# Patient Record
Sex: Female | Born: 1945 | Hispanic: No | Marital: Married | Smoking: Former smoker
Health system: Southern US, Community
[De-identification: ages and names within clinical notes are randomized; demographics above are authoritative.]

## PROBLEM LIST (undated history)

## (undated) DIAGNOSIS — E782 Mixed hyperlipidemia: Secondary | ICD-10-CM

## (undated) DIAGNOSIS — Z95820 Peripheral vascular angioplasty status with implants and grafts: Secondary | ICD-10-CM

## (undated) DIAGNOSIS — I10 Essential (primary) hypertension: Secondary | ICD-10-CM

## (undated) DIAGNOSIS — R6889 Other general symptoms and signs: Secondary | ICD-10-CM

## (undated) DIAGNOSIS — I739 Peripheral vascular disease, unspecified: Secondary | ICD-10-CM

## (undated) DIAGNOSIS — E119 Type 2 diabetes mellitus without complications: Secondary | ICD-10-CM

## (undated) DIAGNOSIS — F17200 Nicotine dependence, unspecified, uncomplicated: Secondary | ICD-10-CM

## (undated) DIAGNOSIS — N200 Calculus of kidney: Secondary | ICD-10-CM

## (undated) DIAGNOSIS — R0602 Shortness of breath: Secondary | ICD-10-CM

## (undated) HISTORY — PX: CATARACT EXTRACTION W/ INTRAOCULAR LENS  IMPLANT, BILATERAL: SHX1307

---
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
## 1999-02-07 ENCOUNTER — Other Ambulatory Visit: Admission: RE | Admit: 1999-02-07 | Discharge: 1999-02-07 | Payer: Self-pay

## 1999-08-04 HISTORY — PX: SHOULDER ARTHROSCOPY: SHX128

## 1999-12-08 ENCOUNTER — Encounter: Admission: RE | Admit: 1999-12-08 | Discharge: 1999-12-29 | Payer: Self-pay

## 2000-04-11 ENCOUNTER — Other Ambulatory Visit: Admission: RE | Admit: 2000-04-11 | Discharge: 2000-04-11 | Payer: Self-pay | Admitting: *Deleted

## 2000-06-27 ENCOUNTER — Ambulatory Visit (HOSPITAL_COMMUNITY): Admission: RE | Admit: 2000-06-27 | Discharge: 2000-06-27 | Payer: Self-pay | Admitting: Gastroenterology

## 2001-04-15 ENCOUNTER — Other Ambulatory Visit: Admission: RE | Admit: 2001-04-15 | Discharge: 2001-04-15 | Payer: Self-pay | Admitting: Obstetrics and Gynecology

## 2002-05-22 ENCOUNTER — Other Ambulatory Visit: Admission: RE | Admit: 2002-05-22 | Discharge: 2002-05-22 | Payer: Self-pay | Admitting: Obstetrics and Gynecology

## 2003-10-11 ENCOUNTER — Encounter: Admission: RE | Admit: 2003-10-11 | Discharge: 2003-10-11 | Payer: Self-pay | Admitting: Internal Medicine

## 2011-12-14 ENCOUNTER — Encounter (HOSPITAL_COMMUNITY)
Admission: AD | Disposition: A | Discharge status: Home / Self Care | Payer: Self-pay | Source: Ambulatory Visit | Attending: Cardiology

## 2011-12-14 ENCOUNTER — Encounter (HOSPITAL_COMMUNITY): Payer: Self-pay | Admitting: Cardiology

## 2011-12-14 ENCOUNTER — Ambulatory Visit (HOSPITAL_COMMUNITY)
Admission: AD | Admit: 2011-12-14 | Discharge: 2011-12-14 | Disposition: A | Discharge status: Home / Self Care | Payer: Medicare Other | Source: Ambulatory Visit | Attending: Cardiology | Admitting: Cardiology

## 2011-12-14 ENCOUNTER — Other Ambulatory Visit (HOSPITAL_COMMUNITY): Payer: Self-pay | Admitting: Cardiology

## 2011-12-14 DIAGNOSIS — I200 Unstable angina: Secondary | ICD-10-CM

## 2011-12-14 DIAGNOSIS — I10 Essential (primary) hypertension: Secondary | ICD-10-CM

## 2011-12-14 DIAGNOSIS — E119 Type 2 diabetes mellitus without complications: Secondary | ICD-10-CM | POA: Insufficient documentation

## 2011-12-14 DIAGNOSIS — I2510 Atherosclerotic heart disease of native coronary artery without angina pectoris: Secondary | ICD-10-CM | POA: Insufficient documentation

## 2011-12-14 DIAGNOSIS — R0609 Other forms of dyspnea: Secondary | ICD-10-CM | POA: Insufficient documentation

## 2011-12-14 DIAGNOSIS — I309 Acute pericarditis, unspecified: Secondary | ICD-10-CM | POA: Insufficient documentation

## 2011-12-14 DIAGNOSIS — R0989 Other specified symptoms and signs involving the circulatory and respiratory systems: Secondary | ICD-10-CM | POA: Insufficient documentation

## 2011-12-14 DIAGNOSIS — E782 Mixed hyperlipidemia: Secondary | ICD-10-CM

## 2011-12-14 DIAGNOSIS — E785 Hyperlipidemia, unspecified: Secondary | ICD-10-CM | POA: Insufficient documentation

## 2011-12-14 DIAGNOSIS — I319 Disease of pericardium, unspecified: Secondary | ICD-10-CM

## 2011-12-14 DIAGNOSIS — E1169 Type 2 diabetes mellitus with other specified complication: Secondary | ICD-10-CM

## 2011-12-14 DIAGNOSIS — F17200 Nicotine dependence, unspecified, uncomplicated: Secondary | ICD-10-CM

## 2011-12-14 HISTORY — DX: Nicotine dependence, unspecified, uncomplicated: F17.200

## 2011-12-14 HISTORY — DX: Mixed hyperlipidemia: E78.2

## 2011-12-14 HISTORY — PX: LEFT HEART CATHETERIZATION WITH CORONARY ANGIOGRAM: SHX5451

## 2011-12-14 HISTORY — DX: Shortness of breath: R06.02

## 2011-12-14 HISTORY — DX: Essential (primary) hypertension: I10

## 2011-12-14 LAB — DIFFERENTIAL
Basophils Relative: 1 % (ref 0–1)
Eosinophils Absolute: 0.1 10*3/uL (ref 0.0–0.7)
Eosinophils Relative: 1 % (ref 0–5)
Lymphs Abs: 2.9 10*3/uL (ref 0.7–4.0)
Monocytes Absolute: 0.6 10*3/uL (ref 0.1–1.0)
Monocytes Relative: 8 % (ref 3–12)

## 2011-12-14 LAB — CBC
HCT: 37.9 % (ref 36.0–46.0)
Hemoglobin: 13.3 g/dL (ref 12.0–15.0)
MCH: 31.7 pg (ref 26.0–34.0)
MCHC: 35.1 g/dL (ref 30.0–36.0)
MCV: 90.2 fL (ref 78.0–100.0)

## 2011-12-14 LAB — CARDIAC PANEL(CRET KIN+CKTOT+MB+TROPI)
CK, MB: 2.6 ng/mL (ref 0.3–4.0)
Total CK: 58 U/L (ref 7–177)
Troponin I: <0.3 ng/mL (ref <0.30)

## 2011-12-14 LAB — COMPREHENSIVE METABOLIC PANEL
Alkaline Phosphatase: 44 U/L (ref 39–117)
BUN: 30 mg/dL — ABNORMAL HIGH (ref 6–23)
CO2: 27 mEq/L (ref 19–32)
Calcium: 10.4 mg/dL (ref 8.4–10.5)
GFR calc Af Amer: 79 mL/min — ABNORMAL LOW (ref >90)
GFR calc non Af Amer: 68 mL/min — ABNORMAL LOW (ref >90)
Glucose, Bld: 225 mg/dL — ABNORMAL HIGH (ref 70–99)
Total Protein: 7 g/dL (ref 6.0–8.3)

## 2011-12-14 LAB — LIPID PANEL
Cholesterol: 232 mg/dL — ABNORMAL HIGH (ref 0–200)
HDL: 55 mg/dL (ref >39)
Total CHOL/HDL Ratio: 4.2 RATIO

## 2011-12-14 LAB — PROTIME-INR: INR: 0.94 (ref 0.00–1.49)

## 2011-12-14 SURGERY — LEFT HEART CATHETERIZATION WITH CORONARY ANGIOGRAM
Anesthesia: LOCAL

## 2011-12-14 MED ORDER — SODIUM CHLORIDE 0.9 % IJ SOLN: 3.0000 mL | INTRAMUSCULAR | Status: DC | PRN

## 2011-12-14 MED ORDER — SODIUM CHLORIDE 0.9 % IV SOLN: INTRAVENOUS | Status: DC

## 2011-12-14 MED ORDER — FENOFIBRATE 160 MG PO TABS: 160.0000 mg | ORAL_TABLET | Freq: Every day | ORAL | Status: DC

## 2011-12-14 MED ORDER — SITAGLIPTIN PHOS-METFORMIN HCL 50-500 MG PO TABS: 1.0000 | ORAL_TABLET | Freq: Two times a day (BID) | ORAL | Status: DC

## 2011-12-14 MED ORDER — LISINOPRIL 5 MG PO TABS: 5.0000 mg | ORAL_TABLET | Freq: Every day | ORAL | Status: DC

## 2011-12-14 MED ORDER — ROSUVASTATIN CALCIUM 10 MG PO TABS
10.0000 mg | ORAL_TABLET | Freq: Every day | ORAL | Status: DC
  Filled 2011-12-14: qty 1

## 2011-12-14 MED ORDER — NITROGLYCERIN 0.4 MG SL SUBL: 0.4000 mg | SUBLINGUAL_TABLET | SUBLINGUAL | Status: DC | PRN

## 2011-12-14 MED ORDER — INFLUENZA VIRUS VACC SPLIT PF IM SUSP: 0.5000 mL | INTRAMUSCULAR | Status: DC

## 2011-12-14 MED ORDER — METOPROLOL TARTRATE 25 MG PO TABS: 25.0000 mg | ORAL_TABLET | Freq: Two times a day (BID) | ORAL | Status: DC

## 2011-12-14 MED ORDER — ASPIRIN 300 MG RE SUPP
300.0000 mg | RECTAL | Status: DC
  Filled 2011-12-14: qty 1

## 2011-12-14 MED ORDER — INSULIN ASPART 100 UNIT/ML ~~LOC~~ SOLN: 15.0000 [IU] | Freq: Three times a day (TID) | SUBCUTANEOUS | Status: DC

## 2011-12-14 MED ORDER — ASPIRIN EC 81 MG PO TBEC: 81.0000 mg | DELAYED_RELEASE_TABLET | Freq: Every day | ORAL | Status: DC

## 2011-12-14 MED ORDER — MIDAZOLAM HCL 2 MG/2ML IJ SOLN
INTRAMUSCULAR | Status: AC
  Filled 2011-12-14: qty 2

## 2011-12-14 MED ORDER — TRIAMTERENE-HCTZ 37.5-25 MG PO CAPS: 1.0000 | ORAL_CAPSULE | ORAL | Status: DC

## 2011-12-14 MED ORDER — ONDANSETRON HCL 4 MG/2ML IJ SOLN: 4.0000 mg | Freq: Four times a day (QID) | INTRAMUSCULAR | Status: DC | PRN

## 2011-12-14 MED ORDER — PNEUMOCOCCAL VAC POLYVALENT 25 MCG/0.5ML IJ INJ: 0.5000 mL | INJECTION | INTRAMUSCULAR | Status: DC

## 2011-12-14 MED ORDER — ACETAMINOPHEN 325 MG PO TABS: 650.0000 mg | ORAL_TABLET | ORAL | Status: DC | PRN

## 2011-12-14 MED ORDER — ASPIRIN 81 MG PO CHEW
324.0000 mg | CHEWABLE_TABLET | ORAL | Status: DC
  Filled 2011-12-14: qty 4

## 2011-12-14 MED ORDER — LIDOCAINE HCL (PF) 1 % IJ SOLN
INTRAMUSCULAR | Status: AC
  Filled 2011-12-14: qty 30

## 2011-12-14 MED ORDER — INSULIN GLARGINE 100 UNIT/ML ~~LOC~~ SOLN: 40.0000 [IU] | Freq: Every day | SUBCUTANEOUS | Status: DC

## 2011-12-14 MED ORDER — HYDROMORPHONE HCL PF 2 MG/ML IJ SOLN
INTRAMUSCULAR | Status: AC
  Filled 2011-12-14: qty 1

## 2011-12-14 MED ORDER — NITROGLYCERIN 0.2 MG/ML ON CALL CATH LAB
INTRAVENOUS | Status: AC
  Filled 2011-12-14: qty 1

## 2011-12-14 MED ORDER — PANTOPRAZOLE SODIUM 40 MG PO TBEC: 40.0000 mg | DELAYED_RELEASE_TABLET | Freq: Every day | ORAL | Status: DC

## 2011-12-14 MED ORDER — CONJ ESTROG-MEDROXYPROGEST ACE 0.45-1.5 MG PO TABS: 1.0000 | ORAL_TABLET | Freq: Every day | ORAL | Status: DC

## 2011-12-14 MED ORDER — ASPIRIN 81 MG PO CHEW: 324.0000 mg | CHEWABLE_TABLET | ORAL | Status: DC

## 2011-12-14 MED ORDER — SODIUM CHLORIDE 0.9 % IJ SOLN
3.0000 mL | Freq: Two times a day (BID) | INTRAMUSCULAR | Status: DC
Start: 2011-12-14 — End: 2011-12-14

## 2011-12-14 MED ORDER — HEPARIN SODIUM (PORCINE) 1000 UNIT/ML IJ SOLN
INTRAMUSCULAR | Status: AC
  Filled 2011-12-14: qty 1

## 2011-12-14 MED ORDER — HEPARIN (PORCINE) IN NACL 2-0.9 UNIT/ML-% IJ SOLN
INTRAMUSCULAR | Status: AC
  Filled 2011-12-14: qty 1000

## 2011-12-14 MED ORDER — METOPROLOL TARTRATE 25 MG PO TABS
25.0000 mg | ORAL_TABLET | Freq: Two times a day (BID) | ORAL | Status: DC
  Filled 2011-12-14 (×2): qty 1

## 2011-12-14 MED ORDER — CALCIUM-VITAMIN D 500-200 MG-UNIT PO TABS: 1.0000 | ORAL_TABLET | Freq: Two times a day (BID) | ORAL | Status: DC

## 2011-12-14 MED ORDER — SODIUM CHLORIDE 0.9 % IV SOLN: 250.0000 mL | INTRAVENOUS | Status: DC | PRN

## 2011-12-14 MED ORDER — ASPIRIN 81 MG PO TBEC: 81.0000 mg | DELAYED_RELEASE_TABLET | Freq: Every day | ORAL | Status: DC

## 2011-12-14 MED ORDER — ALENDRONATE SODIUM 70 MG PO TABS: 70.0000 mg | ORAL_TABLET | ORAL | Status: DC

## 2011-12-14 NOTE — Progress Notes (Signed)
C/O right hand numbness Dr Jacinto Halim notified and per Dr Jacinto Halim client notified to call if right hand gets cold blue or painful and client voiced understanding

## 2011-12-14 NOTE — Brief Op Note (Signed)
12/14/2011  6:15 PM  PATIENT:  [name] [name]  66 y.o. female  PRE-OPERATIVE DIAGNOSIS:  chest pain  POST-OPERATIVE DIAGNOSIS:  CAD, Pericarditis.  PROCEDURE:  Procedure(s): LEFT HEART CATHETERIZATION WITH CORONARY ANGIOGRAM  SURGEON:  Surgeon(s): Pamella Pert  DICTATION: .Other Dictation: Dictation Number [id number]

## 2011-12-14 NOTE — Progress Notes (Signed)
Dr Jacinto Halim notified that after client dressed bruising noted right anterior wrist and is soft and no new orders and ok to d/c home per Dr Jacinto Halim

## 2011-12-14 NOTE — H&P (Signed)
[name] [name] is an 66 y.o. female.   Chief Complaint: Chest pain HOPI: Patient referred  for follow-up/evaluation and treatment of chest pain.     Chest pain started 2-3 years  ago. It is described as pressure. Chest pain is present with activity. Frequency is about 0-3  per week. Worsening  YES in the last 3 days. It is located in the front of the chest region. It does not radiate to the arm. Chest discomfort lasted few minutes before, but last 3 days, on and off all day. Chest discomfort is associated with  dyspnea; No PND,  Orthopnea; Has had occasional Diaphoresis; No Palpitations;  Nausea;  vomiting,  Dizziness or near syncope. Chest pain worse when she climbs a flight of stairs and is associated with dyspnea. This has been ongoing for the last 3 years but has got severe in last 3 days.    Patient c/o shortness of breath.    Shortness of breath gradual and has been ongoing for the last  several years. Patient has a flight of stairs at home and has noticed dyspnea walking on this and states that this is stable, but worsening over the last 3 days. Denies PND or orthopnea.  Past Medical History  Diagnosis Date  . Essential hypertension, benign   . Type II or unspecified type diabetes mellitus with other coma, not stated as uncontrolled   . Mixed hyperlipidemia   . Shortness of breath on exertion   . Tobacco use disorder     Past Surgical History  Procedure Date  . Bilateral oophorectomy   . Shoulder arthroscopy     Family History  Problem Relation Age of Onset  . Adopted: Yes   Social History:  reports that she has been smoking Cigarettes.  She does not have any smokeless tobacco history on file. She reports that she drinks about .5 ounces of alcohol per week. She reports that she does not use illicit drugs.  Allergies: Allergies no known allergies  No current facility-administered medications on file as of .   No current outpatient prescriptions on file as of .    No results  found for this or any previous visit (from the past 48 hour(s)). No results found.  Review of Systems - General ROS: negative for - chills, fatigue, hot flashes, night sweats or  Endocrine ROS: positive for - diabetes mellitus.  Arthritis-no;  Cramping of the legs and feet at night or with activity-yes; Diabetes-yes, Controlled-no;  Hypothyroidism-no;  Previous Stroke-no, Previous GI Bleed-no ,  Recent weight change-no . Erectile dysfunction-N/A, Symptoms to suggest sleep apnea like loud snoring, daytime sleepiness-no, Other systems negative.   Vital signs: HR 72/min. RR 16, BP 170.68 mm hg   General appearance: alert, cooperative and appears stated age Eyes: conjunctivae/corneas clear. PERRL, EOM's intact. Fundi benign. Neck: no adenopathy, no carotid bruit, no JVD, supple, symmetrical, trachea midline and thyroid not enlarged, symmetric, no tenderness/mass/nodules Neck: JVP - normal, carotids 2+= without bruits Resp: clear to auscultation bilaterally Chest wall: no tenderness Cardio: friction rub heard at the apex GI: soft, non-tender; bowel sounds normal; no masses,  no organomegaly Extremities: extremities normal, atraumatic, no cyanosis or edema and no edema, redness or tenderness in the calves or thighs Pulses: 2+ and symmetric Skin: Skin color, texture, turgor normal. No rashes or lesions Neurologic: Alert and oriented X 3, normal strength and tone. Normal symmetric reflexes. Normal coordination and gait  Assessment/Plan 1.  Chest pain probably Unstable angina pectoris.  CARDIAC EXAM:  S1 S2 normal, no gallop or murmur.   A friction rub is heard.   Acute pericarditis giving chest pain cannot be excluded.  However given EKG abnormalities that are new from yesterday in which there is a segment depression noted in inferior and lateral leads my suspicion for unstable angina in a patient with cardiovascular equivalent risk factor is extremely high. ECG 12/14/11: NSR @ 91/min. Normal intervals.  Poor R prog, CRO ant infarct old. Inf-lat ST dep suggests subendocardial infarct vs ischemia. New since yesterday. 2.  Shortness of breath/Dyspnea on exertion. Due to deconditioning, COPD and worse suddenly in the last 3 days.  Anginal equivalent cannot be excluded.  3. Hypertension not at goal. BP 17/60 mm Hg.   4. Hyperlipidemia.   5.  Diabetes Mellitus II controlled.  Rec: Mechanism of underlying disease process and action of medications discussed with the patient.   I discussed primary/secondary prevention and also dietary counceling was done. Continued tobcco use disorder: I have again reinforced abstinance. Unfortunatly continues to use.   Patient instructed to start ASA 81 mg q daily for prophylaxis.  Gave 3  chewable 81 mg tablets here.  Hospital admission: Patient will be admitted to the hospital for further evaluation. May need urgent cardiac catheterization. Schedule for cardiac catheterization. We discussed regarding risks, benefits, alternatives to this including stress testing, CTA and continued medical therapy. Patient wants to proceed. Understands <1-2% risk of death, stroke, MI, urgent CABG, bleeding, infection, renal failure but not limited to these. Pamella Pert, MD 12/14/2011, 3:01 PM

## 2011-12-16 NOTE — Cardiovascular Report (Signed)
[name], [name]             ACCOUNT NO.:  [id number]  MEDICAL RECORD NO.:  [id number]  LOCATION:  [facility]                         FACILITY:  [facility]  PHYSICIAN:  Pamella Pert, MD DATE OF BIRTH:  26-Sep-1946  DATE OF PROCEDURE:  12/14/2011 DATE OF DISCHARGE:  12/14/2011                           CARDIAC CATHETERIZATION   PROCEDURE PERFORMED: 1. Left ventriculography. 2. Selective left and right coronary arteriography. 3. Ascending aortogram.  INDICATION:  [name] is a 66 year old female with history of diabetes, hypertension, hyperlipidemia, and tobacco use who was admitted directly from the office after she presented with ongoing chest pain for the past 3 days.  She also had pericarditis on physical examination. EKG had revealed ST-segment depression compared to yesterday in the inferolateral leads.  With the diagnosis of both acute coronary syndrome and possible peritonitis, she was admitted to the hospital and because of her chest discomfort she was brought to the cardiac catheterization lab the same day as admission to evaluate her coronary anatomy. Ascending aortogram was performed to evaluate for ascending aortic aneurysm and aortic dissection.  HEMODYNAMIC DATA:  Left ventricular pressure was 121/4 with end- diastolic pressure of 7 mmHg.  Aortic pressure was 115/43 with a mean of 63 mmHg.  There was no pressure gradient across the aortic valve.  ANGIOGRAPHIC DATA:  Left ventricle:  Left ventricular systolic function was normal with ejection fraction of 65%.  There was no regional wall motion abnormality.  Right coronary artery:  Right coronary artery is a moderate to large caliber vessel with mild diffuse calcification of the proximal segment. There is mild diffuse luminal irregularity.  It gives origin to moderate- sized PDA which has got diffuse disease and PL branch, which has got a 30% focal stenosis.  Again, fibrocalcific plaque is evident.  Left main:   Left main shows mild diffuse calcification.  Circumflex coronary artery:  Circumflex coronary artery is a moderate- caliber vessel with severely diffusely diseased distal circumflex and a very tiny obtuse marginal 1.  Obtuse marginal 2 is small to moderate sized which has mild disease.  LAD:  LAD is a large-caliber vessel with mild diffuse proximal coronary calcification and mild diffuse luminal irregularity.  It gives origin to a moderate to large sized diagonal 1 which also has mild disease.  Ascending aortogram:  Ascending aortogram revealed presence of 3 aortic valve cusps without evidence of aortic dissection or aortic regurgitation.  IMPRESSION: 1. Diffuse coronary artery disease without any focal stenosis.  A 30%     stenosis is only evident in the PL branch of the right coronary     artery.  No unstable plaque is evident. 2. Normal left ventricular systolic function.  Ejection fraction is     65% with no regional wall motion abnormality. 3. Chest pain is probably due to acute pericarditis with nonspecific     EKG changes.  The patient potentially could be discharged home     today. 4. She will need to continue aggressive risk factor modification     including smoking cessation.  TECHNIQUE OF PROCEDURE:  Under sterile precautions using a 6-French right radial arterial access, a 5-French TIG #4 catheter was advanced into the ascending  aorta.  Then, selective left and right coronary arteriography was performed.  Catheter was then pulled out of the body over an exchange length J-wire.  A 5-French angled pigtail catheter was utilized to perform left ventriculography in the RAO projection.  The catheter was then pulled out of body again over a J-wire.  Hemostasis was obtained by applying TR band.  The patient tolerated the procedure. No immediate complications.     Pamella Pert, MD     JRG/MEDQ  D:  12/14/2011  T:  12/16/2011  Job:  [id number]  cc:   Merlene Laughter.  Renae Gloss, M.D.

## 2011-12-16 NOTE — Discharge Summary (Signed)
NAMEANTONINETTE, [name] [name]             ACCOUNT NO.:  [id number]  MEDICAL RECORD NO.:  [id number]  LOCATION:  [facility]                         FACILITY:  [facility]  PHYSICIAN:  Pamella Pert, MD DATE OF BIRTH:  02-27-1946  DATE OF ADMISSION:  12/14/2011 DATE OF DISCHARGE:  12/14/2011                              DISCHARGE SUMMARY   DISCHARGE DIAGNOSES: 1. Chest pain secondary to acute pericarditis. 2. Coronary artery disease. 3. Diabetes mellitus, type 2, controlled. 4. Hyperlipidemia with lipids done today revealing total cholesterol     232, triglycerides 163, HDL 55, LDL of 144.  This is mixed variety     of hyperlipidemia.  CHIEF COMPLAINT:  Chest pain which started 3 days ago.  HOSPITAL COURSE:  The patient was initially seen at the office with chest pain.  There were new EKG changes in the inferolateral leads suggesting inferolateral ST depression.  She also had pericardial friction rub.  She was admitted to the hospital directly for evaluation of her coronary anatomy given ongoing chest discomfort.  First set of cardiac markers have been negative for myocardial injury.  She underwent cardiac catheterization which essentially revealed diffuse coronary artery disease without any high-grade stenosis.  There was at most a 30% stenosis noted in the PL branch of the right coronary artery. Her ejection fraction was normal at 65%.  The patient was felt stable for discharge as acute coronary syndrome was ruled out.  Hence, she will be discharged home with outpatient risk factor modification including smoking cessation.  Her discharge medications remain the same except she has been started on metoprolol 25 mg p.o. b.i.d. to her present medical regimen that she was on in the outpatient basis.  She will follow up with me in 2 weeks.  Her CMP while in the hospital was within normal limits except her blood sugar was elevated at 225.  Her BUN was 30, creatinine of 0.86.  CBC was within  normal limits.  DISCHARGE MEDICATIONS: 1. Prempro 1 p.o. daily. 2. Fenofibrate 160 mg p.o. daily. 3. NovoLog 15 units a.c. 4. Lantus 40 units nightly. 5. Prinivil 5 mg p.o. daily. 6. Metoprolol 25 mg p.o. b.i.d. 7. Aspirin 81 mg p.o. daily. 8. Protonix 40 mg p.o. daily.     Pamella Pert, MD     JRG/MEDQ  D:  12/14/2011  T:  12/16/2011  Job:  [id number]  cc:   Merlene Laughter. Renae Gloss, M.D.

## 2011-12-16 NOTE — Cardiovascular Report (Signed)
[name], OO             ACCOUNT NO.:  [id number]  MEDICAL RECORD NO.:  [id number]  LOCATION:  [facility]                         FACILITY:  [facility]  PHYSICIAN:  Pamella Pert, MD DATE OF BIRTH:  February 02, 1946  DATE OF PROCEDURE:  12/14/2011 DATE OF DISCHARGE:  12/14/2011                           CARDIAC CATHETERIZATION   PROCEDURES: 1. Left heart catheterization, including left ventriculography. 2. Selective left coronary arteriography. 3. Ascending aortogram.  INDICATION:  [name] is a 66 year old female with a history of diabetes, hypertension, hyperlipidemia, tobacco use, who was admitted to the hospital via the office visit when she presented with ongoing chest pain for the past 3 days.  There were new EKG changes in the inferior leads suggestive of inferolateral ST-segment depression, suggestive of inferior non-ST-elevation myocardial infarction versus ischemia.  She also had pericardial friction rub.  Dictation ended at this point.     Pamella Pert, MD     JRG/MEDQ  D:  12/14/2011  T:  12/16/2011  Job:  [id number]

## 2012-01-02 ENCOUNTER — Ambulatory Visit
Admission: RE | Admit: 2012-01-02 | Discharge: 2012-01-02 | Disposition: A | Discharge status: Home / Self Care | Payer: Medicare Other | Source: Ambulatory Visit | Attending: Cardiology | Admitting: Cardiology

## 2012-01-02 ENCOUNTER — Other Ambulatory Visit: Payer: Self-pay | Admitting: Cardiology

## 2012-01-02 DIAGNOSIS — R0602 Shortness of breath: Secondary | ICD-10-CM

## 2012-01-11 ENCOUNTER — Ambulatory Visit
Admission: RE | Admit: 2012-01-11 | Discharge: 2012-01-11 | Disposition: A | Discharge status: Home / Self Care | Payer: Medicare Other | Source: Ambulatory Visit | Attending: Cardiology | Admitting: Cardiology

## 2012-01-11 ENCOUNTER — Other Ambulatory Visit: Payer: Self-pay | Admitting: Cardiology

## 2012-01-11 DIAGNOSIS — R0602 Shortness of breath: Secondary | ICD-10-CM

## 2012-03-04 ENCOUNTER — Other Ambulatory Visit: Payer: Self-pay | Admitting: Cardiovascular Disease

## 2012-03-18 ENCOUNTER — Encounter (HOSPITAL_COMMUNITY): Payer: Self-pay | Admitting: General Practice

## 2012-03-18 ENCOUNTER — Encounter (HOSPITAL_COMMUNITY)
Admission: RE | Disposition: A | Discharge status: Home / Self Care | Payer: Self-pay | Source: Ambulatory Visit | Attending: Cardiovascular Disease

## 2012-03-18 ENCOUNTER — Ambulatory Visit (HOSPITAL_COMMUNITY)
Admission: RE | Admit: 2012-03-18 | Discharge: 2012-03-19 | Disposition: A | Discharge status: Home / Self Care | Payer: Medicare Other | Source: Ambulatory Visit | Attending: Cardiovascular Disease | Admitting: Cardiovascular Disease

## 2012-03-18 DIAGNOSIS — Z794 Long term (current) use of insulin: Secondary | ICD-10-CM | POA: Insufficient documentation

## 2012-03-18 DIAGNOSIS — I2510 Atherosclerotic heart disease of native coronary artery without angina pectoris: Secondary | ICD-10-CM | POA: Insufficient documentation

## 2012-03-18 DIAGNOSIS — I739 Peripheral vascular disease, unspecified: Secondary | ICD-10-CM | POA: Diagnosis present

## 2012-03-18 DIAGNOSIS — E785 Hyperlipidemia, unspecified: Secondary | ICD-10-CM | POA: Insufficient documentation

## 2012-03-18 DIAGNOSIS — R6889 Other general symptoms and signs: Secondary | ICD-10-CM | POA: Diagnosis present

## 2012-03-18 DIAGNOSIS — E119 Type 2 diabetes mellitus without complications: Secondary | ICD-10-CM | POA: Insufficient documentation

## 2012-03-18 DIAGNOSIS — E782 Mixed hyperlipidemia: Secondary | ICD-10-CM

## 2012-03-18 DIAGNOSIS — I70219 Atherosclerosis of native arteries of extremities with intermittent claudication, unspecified extremity: Secondary | ICD-10-CM | POA: Insufficient documentation

## 2012-03-18 DIAGNOSIS — I10 Essential (primary) hypertension: Secondary | ICD-10-CM | POA: Insufficient documentation

## 2012-03-18 DIAGNOSIS — Z95820 Peripheral vascular angioplasty status with implants and grafts: Secondary | ICD-10-CM

## 2012-03-18 DIAGNOSIS — F17200 Nicotine dependence, unspecified, uncomplicated: Secondary | ICD-10-CM | POA: Insufficient documentation

## 2012-03-18 HISTORY — PX: ATHERECTOMY: SHX5502

## 2012-03-18 HISTORY — DX: Peripheral vascular angioplasty status with implants and grafts: Z95.820

## 2012-03-18 HISTORY — PX: PERIPHERAL ARTERIAL STENT GRAFT: SHX2220

## 2012-03-18 HISTORY — DX: Type 2 diabetes mellitus without complications: E11.9

## 2012-03-18 HISTORY — PX: PTCA: SHX146

## 2012-03-18 HISTORY — DX: Calculus of kidney: N20.0

## 2012-03-18 HISTORY — DX: Other general symptoms and signs: R68.89

## 2012-03-18 HISTORY — DX: Peripheral vascular disease, unspecified: I73.9

## 2012-03-18 LAB — GLUCOSE, CAPILLARY
Glucose-Capillary: 118 mg/dL — ABNORMAL HIGH (ref 70–99)
Glucose-Capillary: 381 mg/dL — ABNORMAL HIGH (ref 70–99)

## 2012-03-18 LAB — POCT ACTIVATED CLOTTING TIME: Activated Clotting Time: 182 seconds

## 2012-03-18 SURGERY — ATHERECTOMY
Anesthesia: LOCAL

## 2012-03-18 MED ORDER — LIDOCAINE HCL (PF) 1 % IJ SOLN
INTRAMUSCULAR | Status: AC
  Filled 2012-03-18: qty 30

## 2012-03-18 MED ORDER — ACETAMINOPHEN 325 MG PO TABS
650.0000 mg | ORAL_TABLET | ORAL | Status: DC | PRN
  Administered 2012-03-19: 650 mg via ORAL
  Filled 2012-03-18: qty 2

## 2012-03-18 MED ORDER — MORPHINE SULFATE 2 MG/ML IJ SOLN: 1.0000 mg | INTRAMUSCULAR | Status: DC | PRN

## 2012-03-18 MED ORDER — SODIUM CHLORIDE 0.9 % IJ SOLN: 3.0000 mL | INTRAMUSCULAR | Status: DC | PRN

## 2012-03-18 MED ORDER — SODIUM CHLORIDE 0.9 % IV SOLN
INTRAVENOUS | Status: DC
  Administered 2012-03-18: 09:00:00 via INTRAVENOUS

## 2012-03-18 MED ORDER — FENTANYL CITRATE 0.05 MG/ML IJ SOLN
INTRAMUSCULAR | Status: AC
  Filled 2012-03-18: qty 2

## 2012-03-18 MED ORDER — INSULIN ASPART 100 UNIT/ML ~~LOC~~ SOLN
0.0000 [IU] | SUBCUTANEOUS | Status: DC
  Filled 2012-03-18: qty 3

## 2012-03-18 MED ORDER — ONDANSETRON HCL 4 MG/2ML IJ SOLN: 4.0000 mg | Freq: Four times a day (QID) | INTRAMUSCULAR | Status: DC | PRN

## 2012-03-18 MED ORDER — HEPARIN SODIUM (PORCINE) 1000 UNIT/ML IJ SOLN
INTRAMUSCULAR | Status: AC
  Filled 2012-03-18: qty 1

## 2012-03-18 MED ORDER — INSULIN ASPART 100 UNIT/ML ~~LOC~~ SOLN
0.0000 [IU] | Freq: Three times a day (TID) | SUBCUTANEOUS | Status: DC
  Administered 2012-03-18: 15 [IU] via SUBCUTANEOUS
  Administered 2012-03-19: 3 [IU] via SUBCUTANEOUS

## 2012-03-18 MED ORDER — SODIUM CHLORIDE 0.9 % IV SOLN: INTRAVENOUS | Status: AC

## 2012-03-18 MED ORDER — HEPARIN (PORCINE) IN NACL 2-0.9 UNIT/ML-% IJ SOLN
INTRAMUSCULAR | Status: AC
  Filled 2012-03-18: qty 1000

## 2012-03-18 MED ORDER — ASPIRIN 81 MG PO CHEW
CHEWABLE_TABLET | ORAL | Status: AC
  Filled 2012-03-18: qty 3

## 2012-03-18 MED ORDER — DIAZEPAM 5 MG PO TABS
5.0000 mg | ORAL_TABLET | ORAL | Status: AC
  Administered 2012-03-18: 5 mg via ORAL
  Filled 2012-03-18: qty 1

## 2012-03-18 NOTE — Op Note (Signed)
OK SON Sandstrom is a 66 y.o. female    [id number] LOCATION:  FACILITY: [facility]  PHYSICIAN: Nanetta Batty, M.D. 1946/04/21   DATE OF PROCEDURE:  03/18/2012  DATE OF DISCHARGE:  [facility]  PV Intervention    History obtained from chart review. Virginia Rochester is a 66 year old married Asian female mother of 2 children referred for peripheral vascular evaluation because of claudication. Her factors include 50-pack-year history of tobacco abuse no smoking or center today, treated hypertension, dyslipidemia and diabetes. She's never had a heart attack or stroke. He does complain of exertional chest pain. Currently she underwent diagnostic catheter this year which revealed noncritical CAD. Doppler studies and a office in 2010 revealed PAD and more recently 02/14/2012 revealed a right ABI of 0.8 with a totally occluded right SFA in the left ABI of 0.66 with a high-grade mid left SFA stenosis. She complains of bilateral leg pain. She presents now for angiography and potential percutaneous intervention for lifestyle limiting claudication.   PROCEDURE DESCRIPTION:    The patient was brought to the second floor  [facility] lab in the postabsorptive state. She was  premedicated with 5 mg of Valium by mouth, and IV fentanyl.. Her right groin was prepped and shaved in usual sterile fashion. Xylocaine 1% was used  for local anesthesia. A 5 French sheath was inserted into the right common femoral  artery using standard Seldinger technique. The patient received  5000 units  of heparin  intravenously.  A 5 French pigtail catheter was used for abdominal aortography bifemoral runoff using bolus chase the digital subtraction step table technique. Visipaque dye was used for the entirety of the case. Retrograde aortic pressure was monitored the case.    HEMODYNAMICS:    AO SYSTOLIC/AO DIASTOLIC: 198/72    ANGIOGRAPHIC RESULTS:   1: Abdominal aortogram-renal artery is widely  patent. Infrarenal, aorta was free of significant atherosclerotic changes.  2: Left lower extremity-the entire fire proximal half of the left SFA was diffusely 50-70% narrowed followed by a nodular 95% telemetry focal stenosis in the midportion. There was three-vessel runoff with a small posterior tibial  3: Right lower extremity-totally occluded right SFA is origin reconstituting imprinters canal was three-vessel runoff   IMPRESSION:high-grade proximal and mid left SFA stenosis with total right SFA. The patient proceed with PTA and stenting of the left SFA using Nitinol self expanding stent.  Procedure description: Patient received a total 7000 units of heparin intravenously. Contralateral access was obtained with crossover catheter, 0.35 cm Wholey wire and 7 [place] crossover sheath.a total of 137 cc of contrast was used during the case. The ending ACT was 215  . The Whole wire was used to cross the subtotally occluded left SFA. Angioplasty was performed using a 4 mm x 100 mm balloon overlapping inflations. Stenting was then performed with a 6 mm x 150 mm Proteg Nitinol self-expanding stent and overlapping 6 mm x 100 mm Abbott absolute pro self-expanding stent. The entire stented segment was then postdilated with a 5 mm x 100 mm balloon resulting in reduction of a 70% segmental proximal mid left SFA and 95% focal mid left SFA stenosis is 0% residual with excellent runoff. The patient tolerated the procedure well. The sheath was then withdrawn across the bifurcation. The patient left the lab in stable condition.   Final impression: Successful PTA and stenting of the left SFA with residual occluded right SFA. The origin of the occlusion on the right side will not allow  percutaneous revascularization. A  femoropopliteal bypass grafting would be required. The sheath will be removed once this the ACT falls below 170. The patient left the Cath Lab in stable condition. She will be hydrated overnight, treated  with aspirin and Plavix and discharged home in the morning.  Runell Gess MD, [facility] 03/18/2012 2:03 PM

## 2012-03-18 NOTE — H&P (Signed)
H & P will be scanned in.  Pt was reexamined and existing H & P reviewed. No changes found.  Runell Gess, MD [facility] 03/18/2012 12:54 PM

## 2012-03-19 ENCOUNTER — Encounter (HOSPITAL_COMMUNITY): Payer: Self-pay | Admitting: Cardiology

## 2012-03-19 DIAGNOSIS — I739 Peripheral vascular disease, unspecified: Secondary | ICD-10-CM

## 2012-03-19 DIAGNOSIS — E119 Type 2 diabetes mellitus without complications: Secondary | ICD-10-CM

## 2012-03-19 DIAGNOSIS — R6889 Other general symptoms and signs: Secondary | ICD-10-CM | POA: Diagnosis present

## 2012-03-19 DIAGNOSIS — Z95820 Peripheral vascular angioplasty status with implants and grafts: Secondary | ICD-10-CM

## 2012-03-19 HISTORY — DX: Peripheral vascular angioplasty status with implants and grafts: Z95.820

## 2012-03-19 HISTORY — DX: Other general symptoms and signs: R68.89

## 2012-03-19 HISTORY — DX: Type 2 diabetes mellitus without complications: E11.9

## 2012-03-19 HISTORY — DX: Peripheral vascular disease, unspecified: I73.9

## 2012-03-19 LAB — BASIC METABOLIC PANEL
BUN: 18 mg/dL (ref 6–23)
Calcium: 9.1 mg/dL (ref 8.4–10.5)
Creatinine, Ser: 0.92 mg/dL (ref 0.50–1.10)
GFR calc Af Amer: 72 mL/min — ABNORMAL LOW (ref >90)
GFR calc non Af Amer: 63 mL/min — ABNORMAL LOW (ref >90)
Glucose, Bld: 220 mg/dL — ABNORMAL HIGH (ref 70–99)
Potassium: 3.8 mEq/L (ref 3.5–5.1)

## 2012-03-19 LAB — POCT ACTIVATED CLOTTING TIME: Activated Clotting Time: 215 seconds

## 2012-03-19 LAB — CBC
Hemoglobin: 11.3 g/dL — ABNORMAL LOW (ref 12.0–15.0)
MCH: 31.4 pg (ref 26.0–34.0)
MCHC: 34.1 g/dL (ref 30.0–36.0)
Platelets: 282 10*3/uL (ref 150–400)
RDW: 12 % (ref 11.5–15.5)

## 2012-03-19 LAB — GLUCOSE, CAPILLARY: Glucose-Capillary: 209 mg/dL — ABNORMAL HIGH (ref 70–99)

## 2012-03-19 MED ORDER — ZOLPIDEM TARTRATE 5 MG PO TABS
5.0000 mg | ORAL_TABLET | Freq: Every evening | ORAL | Status: DC | PRN
  Administered 2012-03-19: 5 mg via ORAL
  Filled 2012-03-19: qty 1

## 2012-03-19 MED ORDER — ASPIRIN 325 MG PO TABS: 325.0000 mg | ORAL_TABLET | Freq: Every day | ORAL | Status: AC

## 2012-03-19 MED ORDER — METOPROLOL TARTRATE 25 MG PO TABS: 25.0000 mg | ORAL_TABLET | Freq: Two times a day (BID) | ORAL | Status: DC

## 2012-03-19 MED ORDER — FENOFIBRATE 160 MG PO TABS
160.0000 mg | ORAL_TABLET | Freq: Every day | ORAL | Status: DC
  Administered 2012-03-19: 160 mg via ORAL
  Filled 2012-03-19 (×2): qty 1

## 2012-03-19 MED ORDER — LISINOPRIL 5 MG PO TABS
5.0000 mg | ORAL_TABLET | Freq: Every day | ORAL | Status: DC
  Administered 2012-03-19: 5 mg via ORAL
  Filled 2012-03-19 (×2): qty 1

## 2012-03-19 MED ORDER — PANTOPRAZOLE SODIUM 40 MG PO TBEC: 40.0000 mg | DELAYED_RELEASE_TABLET | Freq: Every day | ORAL | Status: DC

## 2012-03-19 MED ORDER — ASPIRIN 325 MG PO TABS
325.0000 mg | ORAL_TABLET | Freq: Every day | ORAL | Status: DC
  Administered 2012-03-19: 325 mg via ORAL
  Filled 2012-03-19 (×2): qty 1

## 2012-03-19 MED ORDER — L-METHYLFOLATE-B6-B12 3-35-2 MG PO TABS
1.0000 | ORAL_TABLET | Freq: Every day | ORAL | Status: DC
  Filled 2012-03-19 (×2): qty 1

## 2012-03-19 MED ORDER — INSULIN GLARGINE 100 UNIT/ML ~~LOC~~ SOLN
50.0000 [IU] | Freq: Every day | SUBCUTANEOUS | Status: DC
  Administered 2012-03-19: 50 [IU] via SUBCUTANEOUS

## 2012-03-19 MED ORDER — CLOPIDOGREL BISULFATE 75 MG PO TABS: 75.0000 mg | ORAL_TABLET | Freq: Every day | ORAL | Status: AC

## 2012-03-19 MED ORDER — SIMVASTATIN 20 MG PO TABS
20.0000 mg | ORAL_TABLET | Freq: Every day | ORAL | Status: DC
  Filled 2012-03-19: qty 1

## 2012-03-19 MED ORDER — TRIAMTERENE-HCTZ 37.5-25 MG PO CAPS
1.0000 | ORAL_CAPSULE | Freq: Every day | ORAL | Status: DC
Start: 2012-03-19 — End: 2012-03-19
  Administered 2012-03-19: 1 via ORAL
  Filled 2012-03-19: qty 1

## 2012-03-19 MED ORDER — METOPROLOL TARTRATE 25 MG PO TABS
25.0000 mg | ORAL_TABLET | Freq: Two times a day (BID) | ORAL | Status: DC
  Administered 2012-03-19: 25 mg via ORAL
  Filled 2012-03-19 (×2): qty 1

## 2012-03-19 MED ORDER — CLOPIDOGREL BISULFATE 75 MG PO TABS
75.0000 mg | ORAL_TABLET | Freq: Every day | ORAL | Status: DC
  Administered 2012-03-19: 75 mg via ORAL
  Filled 2012-03-19: qty 1

## 2012-03-19 NOTE — Consult Note (Signed)
Pt has cut down from smoking 1/2 ppd to 5 cigarettes but says she's going up on the number of cigarettes again. She is in action stage and wants to quit. She's quit once in the past for a year using patches. Smokes within the first 30 mins after waking up. Recommended 14 mg patch to start with and if she feels nauseas or that it is too much she can lower the dose to 7 mg patch but pt insists she wants to start on the higher patch and not the 7 mg. suspect she is smoking more than what she is admitting. Pt voices understanding. Referred to 1-800 quit now for f/u and support. Discussed oral fixation substitutes, second hand smoke and in home smoking policy. Reviewed and gave pt Written education/contact information.

## 2012-03-19 NOTE — Progress Notes (Signed)
Subjective: No complaints  Objective: Vital signs in last 24 hours: Temp:  [98 F (36.7 C)-98.3 F (36.8 C)] 98.1 F (36.7 C) (04/17 0738) Pulse Rate:  [63-78] 73  (04/17 0738) Resp:  [17-20] 18  (04/17 0738) BP: (113-160)/(32-56) 139/47 mmHg (04/17 0738) SpO2:  [95 %-99 %] 95 % (04/17 0738) Weight change:  Last BM Date: 03/17/12 Intake/Output from previous day: 04/16 0701 - 04/17 0700 In: 705 [P.O.:480; I.V.:225] Out: -  Intake/Output this shift: Total I/O In: 240 [P.O.:240] Out: -   PE: General: No cp or sob No JVD Heart:RRR 1/6 sem Lungs:clear ZOX:WRUE BS= Small ecchymosis R groin AVW:UJWJ with good pulses    Lab Results:  Basename 03/19/12 0405  WBC 7.3  HGB 11.3*  HCT 33.1*  PLT 282   BMET  Basename 03/19/12 0405  NA 138  K 3.8  CL 105  CO2 27  GLUCOSE 220*  BUN 18  CREATININE 0.92  CALCIUM 9.1   No results found for this basename: TROPONINI:2,CK,MB:2 in the last 72 hours  Lab Results  Component Value Date   CHOL 232* 12/14/2011   HDL 55 12/14/2011   LDLCALC 144* 12/14/2011   TRIG 163* 12/14/2011   CHOLHDL 4.2 12/14/2011   No results found for this basename: HGBA1C     Lab Results  Component Value Date   TSH 1.520 12/14/2011    Hepatic Function Panel No results found for this basename: PROT,ALBUMIN,AST,ALT,ALKPHOS,BILITOT,BILIDIR,IBILI in the last 72 hours No results found for this basename: CHOL in the last 72 hours No results found for this basename: PROTIME in the last 72 hours    EKG:No orders found for this or any previous visit.  Studies/Results:PV Intervention: Successful PTA and stenting of the left SFA with residual occluded right SFA. The origin of the occlusion on the right side will not allow percutaneous revascularization. A femoropopliteal bypass grafting would be required. The sheath will be removed once this the ACT falls below 170. The patient left the Cath Lab in stable condition   Medications: I have reviewed the  patient's current medications.    Marland Kitchen aspirin      . diazepam  5 mg Oral On Call  . fentaNYL      . heparin      . heparin      . insulin aspart  0-15 Units Subcutaneous TID WC  . lidocaine      . DISCONTD: insulin aspart  0-9 Units Subcutaneous Q4H   Assessment/Plan: Patient Active Problem List  Diagnoses  . Hyperlipidemia, mixed  . Type II or unspecified type diabetes mellitus with other coma, not stated as uncontrolled  . Essential hypertension, benign  . Tobacco dependence  . Pericarditis  . PVD (peripheral vascular disease), with leg pain - totally occluded Rt. SFA and stenosis in Lt. SFA  . Abnormal ankle brachial index, Rt. 0.8, Lt. 0.66  . Status post angioplasty with stent to Lt SFA 03/18/12   PLAN:  Stable, ambulate, if no problems d/c home with outpt dopplers and f/u with Dr. Allyson Sabal.  LOS: 1 day   INGOLD,LAURA R 03/19/2012, 9:01 AM   Patient seen and examined. Agree with assessment and plan.  Feels well. No CP, SOB or leg discomfort. Left leg warm with good pulses.  Plan DC today and f/u with Dr. Allyson Sabal.   Lennette Bihari, MD, [facility] 03/19/2012 9:04 AM

## 2012-03-19 NOTE — Discharge Summary (Signed)
Physician Discharge Summary  Patient ID: [name] [name] MRN: [id number] DOB/AGE: 1946/10/04 66 y.o.  Admit date: 03/18/2012 Discharge date: 03/19/2012  Discharge Diagnoses:  Principal Problem:  *Abnormal ankle brachial index, Rt. 0.8, Lt. 0.66 Active Problems:  PVD (peripheral vascular disease), with leg pain - totally occluded Rt. SFA and stenosis in Lt. SFA  Status post angioplasty with stent to Lt SFA 03/18/12  Hyperlipidemia, mixed  DM (diabetes mellitus), stable, insulin dependent   Discharged Condition: good  Procedures: 03/18/2012 PV angiogram by Dr. Nanetta Batty 03/18/2012 PTA and stent of the left SFA by Dr. Hattie Perch Course: [name] [name] is a 66 year old married Asian female mother of 2 children referred for peripheral vascular evaluation because of claudication. Her factors include 50-pack-year history of tobacco abuse no smoking or center today, treated hypertension, dyslipidemia and diabetes. She's never had a heart attack or stroke. He does complain of exertional chest pain. Currently she underwent diagnostic catheter this year which revealed noncritical CAD. Doppler studies and a office in 2010 revealed PAD and more recently 02/14/2012 revealed a right ABI of 0.8 with a totally occluded right SFA in the left ABI of 0.66 with a high-grade mid left SFA stenosis. She complains of bilateral leg pain. She presented for angiography and potential percutaneous intervention for lifestyle limiting claudication.  PV angiogram revealed: 1: Abdominal aortogram-renal artery is widely patent. Infrarenal, aorta was free of significant atherosclerotic changes.  2: Left lower extremity-the entire fire proximal half of the left SFA was diffusely 50-70% narrowed followed by a nodular 95% telemetry focal stenosis in the midportion. There was three-vessel runoff with a small posterior tibial  3: Right lower extremity-totally occluded right SFA is origin reconstituting imprinters  canal was three-vessel runoff  IMPRESSION:high-grade proximal and mid left SFA stenosis with total right SFA. The patient proceed with PTA and stenting of the left SFA using Nitinol self expanding stent  Patient then underwent successful PTA and stenting of the left SFA with residual occluded right SFA. The origin of the occlusion on the right side will not allow percutaneous revascularization. A femoropopliteal bypass grafting would be required.  By the next morning her vital signs were stable her lab work was stable and she had no acute issues. She was seen and evaluated by Dr. Tresa Endo felt she was ready for discharge.  She will be discharged with aspirin and Plavix.  She is scheduled for outpatient Doppler studies and then followup with Dr. Allyson Sabal   Consults: None  Significant Diagnostic Studies: labs at discharge sodium 138 potassium 3.8 chloride 105 CO2 27 BUN 18 creatinine 0.92 calcium 9.1 glucose 220  Hemoglobin 11.3 hematocrit 33.1 WBC 7.3 platelets 282.   Discharge Exam: Blood pressure 118/40, pulse 63, temperature 98.7 F (37.1 C), temperature source Oral, resp. rate 18, height 5\' 3"  (1.6 m), weight 59.875 kg (132 lb), SpO2 97.00%.   General: No cp or sob  No JVD  Heart:RRR 1/6 sem  Lungs:clear  WUJ:WJXB BS=  Small ecchymosis R groin  JYN:WGNF with good pulses  Disposition: 01-Home or Self Care   Medication List  As of 03/19/2012 12:09 PM   STOP taking these medications         aspirin 81 MG EC tablet         TAKE these medications         alendronate 70 MG tablet   Commonly known as: FOSAMAX   Take 70 mg by mouth every 7 (seven) days. Take with a full  glass of water on an empty stomach.      aspirin 325 MG tablet   Take 1 tablet (325 mg total) by mouth daily.      clopidogrel 75 MG tablet   Commonly known as: PLAVIX   Take 1 tablet (75 mg total) by mouth daily with breakfast.      esomeprazole 40 MG capsule   Commonly known as: NEXIUM   Take 40 mg by mouth  daily before breakfast.      estrogen (conjugated)-medroxyprogesterone 0.45-1.5 MG per tablet   Commonly known as: PREMPRO   Take 1 tablet by mouth daily.      fenofibrate 160 MG tablet   Take 160 mg by mouth daily.      insulin aspart 100 UNIT/ML injection   Commonly known as: novoLOG   Inject into the skin 3 (three) times daily before meals. Sliding scale      insulin glargine 100 UNIT/ML injection   Commonly known as: LANTUS   Inject 50 Units into the skin every morning.      l-methylfolate-B6-B12 3-35-2 MG Tabs   Commonly known as: METANX   Take 1 tablet by mouth daily.      lisinopril 5 MG tablet   Commonly known as: PRINIVIL,ZESTRIL   Take 5 mg by mouth daily.      metoprolol tartrate 25 MG tablet   Commonly known as: LOPRESSOR   Take 25 mg by mouth 2 (two) times daily.      pravastatin 20 MG tablet   Commonly known as: PRAVACHOL   Take 20 mg by mouth every evening.      sitaGLIPtan-metformin 50-500 MG per tablet   Commonly known as: JANUMET   Take 1 tablet by mouth 2 (two) times daily with a meal.      triamterene-hydrochlorothiazide 37.5-25 MG per capsule   Commonly known as: DYAZIDE   Take 1 capsule by mouth every morning.           Follow-up Information    Follow up with Runell Gess, MD on 04/09/2012. (at 9:00 am for dopplers of your legs)    Contact information:   [address] [place] [address] [phone]       Follow up with Runell Gess, MD on 04/14/2012. (at 4:00 pm  for follow- up)    Contact information:   [address] [place] [address] [phone]        discharge instructions: Call [facility] if any bleeding, swelling or drainage at cath site.  May shower, no tub baths for 48 hours for groin sticks.   Plavix is to help keep your stent open.  Do not use the Janumet until 03/21/12, it may interact with the cath dye.  You may resume on  the 19th.  Monitor glucose closely while you are off the med.  Diabetic heart healthy diet  No driving for 3 days.  No lifting for 4 days.  Stop smoking.   SignedLeone Brand 03/19/2012, 12:09 PM

## 2012-03-19 NOTE — Discharge Instructions (Signed)
Call [facility] if any bleeding, swelling or drainage at cath site.  May shower, no tub baths for 48 hours for groin sticks.   Plavix is to help keep your stent open.  Do not use the Janumet until 03/21/12, it may interact with the cath dye.  You may resume on the 19th.  Monitor glucose closely while you are off the med.  Diabetic heart healthy diet  No driving for 3 days.  No lifting for 4 days.   Stop smoking.

## 2013-05-22 ENCOUNTER — Other Ambulatory Visit: Payer: Self-pay | Admitting: Cardiovascular Disease

## 2013-05-22 NOTE — Telephone Encounter (Signed)
Rx was sent to pharmacy electronically. 

## 2013-06-01 ENCOUNTER — Telehealth: Payer: Self-pay | Admitting: Cardiovascular Disease

## 2013-06-01 NOTE — Telephone Encounter (Signed)
Needs to have 3 teeth extracted and bumps removed from gum area.  Ward Morrison Old, DDS needs authorization for pt to hold plavix x 7 days. Request sent to Dr. Allyson Sabal

## 2013-06-01 NOTE — Telephone Encounter (Signed)
Going to have dental work-they want her to stop her Clopidogrel Bisulfate for 7 days-Is this all right?

## 2013-06-04 NOTE — Telephone Encounter (Signed)
lmom for [name] [name] that letter will be sent.  Letter was sent to Dr Morrison Old saying it was okay to hold Plavix per Dr Allyson Sabal.

## 2013-06-04 NOTE — Telephone Encounter (Signed)
Please advise 

## 2013-06-04 NOTE — Telephone Encounter (Signed)
Patient can stop her clopidogrel for her dental or and then restart one to 2 weeks afterwards

## 2013-06-08 ENCOUNTER — Telehealth: Payer: Self-pay | Admitting: Cardiovascular Disease

## 2013-06-08 NOTE — Telephone Encounter (Signed)
Want to know if Mrs. [name] [name] can come off of her blood thinner (Clopidogrel Bisulface) for 5 before her oral surgery .Marland Kitchen Please Call    Thanks

## 2013-06-08 NOTE — Telephone Encounter (Signed)
Returned call and spoke w/ [name] [name], pt's husband.  Stated pt needs to have teeth extracted and needs to be off her blood thinner for 5 days before and 2 days after procedure.  Stated the dentist is Dr. Elesa Massed [phone] phone [phone] fax.  [name] informed Dr. Allyson Sabal will be notified for further instructions.  He would like the letter mailed to: [address] [place], [place], [place] [address].  Husband verbalized understanding and agreed w/ plan.  Message forwarded to Dr. Allyson Sabal for review and further instructions.  Chart# [id number] on cart.

## 2013-06-11 NOTE — Telephone Encounter (Signed)
Medical records processed request.

## 2013-06-11 NOTE — Telephone Encounter (Signed)
Husband is here checking on the status of letter. He stated that he needs it asap.

## 2013-06-13 NOTE — Telephone Encounter (Signed)
Has this been taken care of? I think I addressed it with Nya  JJB

## 2013-06-15 NOTE — Telephone Encounter (Signed)
This has been processed.  No further action needed.

## 2013-06-18 ENCOUNTER — Encounter: Payer: Self-pay | Admitting: Cardiovascular Disease

## 2013-06-18 ENCOUNTER — Ambulatory Visit (INDEPENDENT_AMBULATORY_CARE_PROVIDER_SITE_OTHER): Payer: Medicare Other | Admitting: Cardiovascular Disease

## 2013-06-18 VITALS — BP 136/68 | HR 71 | Ht 63.5 in | Wt 145.7 lb

## 2013-06-18 DIAGNOSIS — Z9889 Other specified postprocedural states: Secondary | ICD-10-CM

## 2013-06-18 DIAGNOSIS — I739 Peripheral vascular disease, unspecified: Secondary | ICD-10-CM

## 2013-06-18 DIAGNOSIS — I10 Essential (primary) hypertension: Secondary | ICD-10-CM

## 2013-06-18 DIAGNOSIS — Z95820 Peripheral vascular angioplasty status with implants and grafts: Secondary | ICD-10-CM

## 2013-06-18 DIAGNOSIS — E782 Mixed hyperlipidemia: Secondary | ICD-10-CM

## 2013-06-18 NOTE — Assessment & Plan Note (Signed)
Status post PTA and stenting of a subtotally occluded left SFA with 2 overlapping Nitinol self-expanding stents on 03/18/12. She is a known occluded right SFA with three-vessel runoff bilaterally. Her most recent lower shimmy Dopplers performed 04/24/12 revealed a right ABI 0.7L. Left ABI 0.99 with an occluded right SFA and a widely patent left SFA stent. She really denies a difference in how her legs feel and continues to say that her left leg does not feel any different since her revascularization procedure.

## 2013-06-18 NOTE — Assessment & Plan Note (Signed)
On lipid lowering therapy followed by her PCP

## 2013-06-18 NOTE — Progress Notes (Signed)
06/18/2013 OK SON Bachus   Jul 08, 1946  [id number]  Primary Physician Alva Garnet., MD Primary Cardiologist: Runell Gess MD Roseanne Reno   HPI:  The patient is a 67 year old, married Asian female, mother of 2, grandmother to 4 grandchildren who is accompanied by her husband today. I saw her 3 months ago. She was referred to me for peripheral vascular occlusive disease and claudication. She has noncritical CAD by cath performed by Dr. Yates Decamp December 14, 2011. Her cardiac risk factors include continued to tobacco abuse of 50 pack years, treated hypertension, dyslipidemia and diabetes. I angiogram'd her March 18, 2012, revealing high-grade diffuse segmental proximal mid left SFA stenosis which I stented using overlapping ED3 and absolute PRO Nitinol self-expanding stents. She had 3-vessel runoff bilaterally with an occluded right SFA. Followup Dopplers revealed an increase in left ABI from 0.66 to 0.99, though she is not sure whether her claudication improved. Recent carotid Dopplers were essentially normal and lab work revealed total cholesterol of 208, LDL 130 and HDL of 56.    Current Outpatient Prescriptions  Medication Sig Dispense Refill  . alendronate (FOSAMAX) 70 MG tablet Take 70 mg by mouth every 7 (seven) days. Take with a full glass of water on an empty stomach.      Marland Kitchen aspirin EC 81 MG tablet Take 81 mg by mouth daily.      . calcium carbonate (OS-CAL) 600 MG TABS Take 600 mg by mouth 2 (two) times daily with a meal.      . esomeprazole (NEXIUM) 40 MG capsule Take 40 mg by mouth daily before breakfast.      . estrogen, conjugated,-medroxyprogesterone (PREMPRO) 0.45-1.5 MG per tablet Take 1 tablet by mouth daily.      . fenofibrate 160 MG tablet Take 160 mg by mouth daily.      . insulin aspart (NOVOLOG) 100 UNIT/ML injection Inject into the skin 3 (three) times daily before meals. Sliding scale      . insulin glargine (LANTUS) 100 UNIT/ML injection Inject  50 Units into the skin every morning.      Marland Kitchen l-methylfolate-B6-B12 (METANX) 3-35-2 MG TABS Take 1 tablet by mouth daily.      Marland Kitchen lisinopril (PRINIVIL,ZESTRIL) 5 MG tablet Take 5 mg by mouth daily.      . metoprolol tartrate (LOPRESSOR) 25 MG tablet Take 25 mg by mouth 2 (two) times daily.      . pravastatin (PRAVACHOL) 20 MG tablet Take 20 mg by mouth every evening.      . sitaGLIPtan-metformin (JANUMET) 50-500 MG per tablet Take 1 tablet by mouth 2 (two) times daily with a meal.      . triamterene-hydrochlorothiazide (DYAZIDE) 37.5-25 MG per capsule Take 1 capsule by mouth every morning.      . clopidogrel (PLAVIX) 75 MG tablet TAKE 1 TABLET DAILY  90 tablet  1   No current facility-administered medications for this visit.    No Known Allergies  History   Social History  . Marital Status: Married    Spouse Name: N/A    Number of Children: N/A  . Years of Education: N/A   Occupational History  . Not on file.   Social History Main Topics  . Smoking status: Former Smoker -- 0.50 packs/day for 50 years    Types: Cigarettes    Quit date: 12/03/2012  . Smokeless tobacco: Never Used  . Alcohol Use: No     Comment: 03/18/12 "occasionally have one mixed drink"  .  Drug Use: No  . Sexually Active: Not Currently   Other Topics Concern  . Not on file   Social History Narrative  . No narrative on file     Review of Systems: General: negative for chills, fever, night sweats or weight changes.  Cardiovascular: negative for chest pain, dyspnea on exertion, edema, orthopnea, palpitations, paroxysmal nocturnal dyspnea or shortness of breath Dermatological: negative for rash Respiratory: negative for cough or wheezing Urologic: negative for hematuria Abdominal: negative for nausea, vomiting, diarrhea, bright red blood per rectum, melena, or hematemesis Neurologic: negative for visual changes, syncope, or dizziness All other systems reviewed and are otherwise negative except as noted  above.    Blood pressure 136/68, pulse 71, height 5' 3.5" (1.613 m), weight 145 lb 11.2 oz (66.089 kg).  General appearance: alert and no distress Neck: no adenopathy, no JVD, supple, symmetrical, trachea midline, thyroid not enlarged, symmetric, no tenderness/mass/nodules and soft bilateral carotid bruits left greater than right  Lungs: clear to auscultation bilaterally Heart: regular rate and rhythm, S1, S2 normal, no murmur, click, rub or gallop Extremities: extremities normal, atraumatic, no cyanosis or edema  EKG not performed today  ASSESSMENT AND PLAN:   Status post angioplasty with stent to Lt SFA 03/18/12 Status post PTA and stenting of a subtotally occluded left SFA with 2 overlapping Nitinol self-expanding stents on 03/18/12. She is a known occluded right SFA with three-vessel runoff bilaterally. Her most recent lower shimmy Dopplers performed 04/24/12 revealed a right ABI 0.7L. Left ABI 0.99 with an occluded right SFA and a widely patent left SFA stent. She really denies a difference in how her legs feel and continues to say that her left leg does not feel any different since her revascularization procedure.  Hyperlipidemia, mixed On lipid lowering therapy followed by her PCP  Essential hypertension, benign Well-controlled on current medications      Runell Gess MD [facility], [facility] 06/18/2013 3:48 PM

## 2013-06-18 NOTE — Assessment & Plan Note (Signed)
Well-controlled on current medications 

## 2013-06-18 NOTE — Patient Instructions (Addendum)
Your physician wants you to follow-up in: 12 months with Dr Allyson Sabal. You will receive a reminder letter in the mail two months in advance. If you don't receive a letter, please call our office to schedule the follow-up appointment.  Dr Allyson Sabal has ordered a lower extremity doppler

## 2013-06-26 ENCOUNTER — Other Ambulatory Visit: Payer: Self-pay | Admitting: *Deleted

## 2013-06-26 MED ORDER — METOPROLOL TARTRATE 25 MG PO TABS: 25.0000 mg | ORAL_TABLET | Freq: Two times a day (BID) | ORAL | Status: DC

## 2013-06-26 MED ORDER — CLOPIDOGREL BISULFATE 75 MG PO TABS: 75.0000 mg | ORAL_TABLET | Freq: Every day | ORAL | Status: DC

## 2013-07-01 ENCOUNTER — Ambulatory Visit (HOSPITAL_COMMUNITY)
Admission: RE | Admit: 2013-07-01 | Discharge: 2013-07-01 | Disposition: A | Discharge status: Home / Self Care | Payer: Medicare Other | Source: Ambulatory Visit | Attending: Cardiovascular Disease | Admitting: Cardiovascular Disease

## 2013-07-01 DIAGNOSIS — I739 Peripheral vascular disease, unspecified: Secondary | ICD-10-CM

## 2013-07-01 NOTE — Progress Notes (Signed)
Lower Extremity Arterial Duplex Completed. °[name] [name] ° °

## 2013-07-07 ENCOUNTER — Encounter: Payer: Self-pay | Admitting: *Deleted

## 2013-07-07 ENCOUNTER — Telehealth: Payer: Self-pay | Admitting: *Deleted

## 2013-07-07 DIAGNOSIS — I739 Peripheral vascular disease, unspecified: Secondary | ICD-10-CM

## 2013-07-07 NOTE — Telephone Encounter (Signed)
Message copied by Marella Bile on Tue Jul 07, 2013  9:11 AM ------      Message from: Runell Gess      Created: Fri Jul 03, 2013  9:53 PM       Worsening LSFA velocities. Repeat in 3 months ------

## 2013-08-21 ENCOUNTER — Encounter: Payer: Self-pay | Admitting: Cardiovascular Disease

## 2013-08-21 ENCOUNTER — Other Ambulatory Visit: Payer: Self-pay | Admitting: Gastroenterology

## 2013-08-21 DIAGNOSIS — R112 Nausea with vomiting, unspecified: Secondary | ICD-10-CM

## 2013-08-28 ENCOUNTER — Encounter: Payer: Self-pay | Admitting: Cardiovascular Disease

## 2013-09-10 ENCOUNTER — Encounter (HOSPITAL_COMMUNITY)
Admission: RE | Admit: 2013-09-10 | Discharge: 2013-09-10 | Disposition: A | Discharge status: Home / Self Care | Payer: Medicare Other | Source: Ambulatory Visit | Attending: Gastroenterology | Admitting: Gastroenterology

## 2013-09-10 ENCOUNTER — Ambulatory Visit (HOSPITAL_COMMUNITY)
Admission: RE | Admit: 2013-09-10 | Discharge: 2013-09-10 | Disposition: A | Discharge status: Home / Self Care | Payer: Medicare Other | Source: Ambulatory Visit | Attending: Gastroenterology | Admitting: Gastroenterology

## 2013-09-10 DIAGNOSIS — R112 Nausea with vomiting, unspecified: Secondary | ICD-10-CM | POA: Insufficient documentation

## 2013-09-10 DIAGNOSIS — R109 Unspecified abdominal pain: Secondary | ICD-10-CM | POA: Insufficient documentation

## 2013-09-10 MED ORDER — STERILE WATER FOR INJECTION IJ SOLN
INTRAMUSCULAR | Status: AC
  Administered 2013-09-10: 5 mL
  Filled 2013-09-10: qty 10

## 2013-09-10 MED ORDER — TECHNETIUM TC 99M MEBROFENIN IV KIT
5.0000 | PACK | Freq: Once | INTRAVENOUS | Status: AC | PRN
  Administered 2013-09-10: 5 via INTRAVENOUS

## 2013-09-10 MED ORDER — SINCALIDE 5 MCG IJ SOLR
INTRAMUSCULAR | Status: AC
  Filled 2013-09-10: qty 5

## 2013-09-10 MED ORDER — SINCALIDE 5 MCG IJ SOLR
0.0200 ug/kg | Freq: Once | INTRAMUSCULAR | Status: AC
  Administered 2013-09-10: 5 ug via INTRAVENOUS

## 2013-09-25 ENCOUNTER — Encounter (INDEPENDENT_AMBULATORY_CARE_PROVIDER_SITE_OTHER): Payer: Self-pay

## 2013-09-25 ENCOUNTER — Encounter (INDEPENDENT_AMBULATORY_CARE_PROVIDER_SITE_OTHER): Payer: Self-pay | Admitting: General Surgery

## 2013-09-25 ENCOUNTER — Ambulatory Visit (INDEPENDENT_AMBULATORY_CARE_PROVIDER_SITE_OTHER): Payer: Medicare Other | Admitting: General Surgery

## 2013-09-25 VITALS — BP 132/64 | HR 74 | Temp 97.6°F | Resp 14 | Ht 64.0 in | Wt 143.0 lb

## 2013-09-25 DIAGNOSIS — K828 Other specified diseases of gallbladder: Secondary | ICD-10-CM

## 2013-09-25 NOTE — Progress Notes (Signed)
Patient ID: [name] [name], female   DOB: [birth year], 67 y.o.   MRN: [id number]  Chief Complaint  Patient presents with  . New Evaluation    eval biliary dyskinesia    HPI [name] [name] is a 67 y.o. female.   HPI  She is referred by Dr. Loreta Ave because of upper abdominal pain with nausea and biliary dyskinesia hepatobiliary scan. She's been having symptoms like this for a number of years. Spicy foods makes his symptoms worse. She underwent an upper endoscopy which demonstrated some mild gastritis. Biopsy was consistent with peptic type duodenopathy. Ultrasound demonstrated a normal gallbladder and common bile duct diameter. A nuclear medicine hepatobiliary scan demonstrated an ejection fraction of approximately 6% with normal being 30%. She started on Nexium and since then her symptoms have completely resolved. She states sometimes she feels a twisting-type pain but has not felt that since she's been on Nexium. She also describes a little heartburn and bloating but has not had any of that since she's been on Nexium.  She is not eating as much spicy food which has been helping as well.  Past Medical History  Diagnosis Date  . Essential hypertension, benign   . Mixed hyperlipidemia   . Shortness of breath on exertion   . Tobacco use disorder   . Type II diabetes mellitus   . Shortness of breath on exertion   . Kidney stone     "long time ago"  . PVD (peripheral vascular disease), with leg pain - totally occluded Rt. SFA and stenosis in Lt. SFA 03/19/2012  . Abnormal ankle brachial index, Rt. 0.8, Lt. 0.66 03/19/2012  . Status post angioplasty with stent to Lt SFA 03/18/12 03/19/2012  . DM (diabetes mellitus), stable, insulin dependent 03/19/2012    Past Surgical History  Procedure Laterality Date  . Shoulder arthroscopy  2000's    "I think it was left"  . Abdominal hysterectomy  1960's    w/BSO  . Cataract extraction w/ intraocular lens  implant, bilateral  ~ 2003  . Peripheral arterial  stent graft  03/18/12  . Ptca  03/18/12    w/stenting LFA    Family History  Problem Relation Age of Onset  . Adopted: Yes    Social History History  Substance Use Topics  . Smoking status: Former Smoker -- 0.50 packs/day for 50 years    Types: Cigarettes    Quit date: 12/04/2011  . Smokeless tobacco: Never Used  . Alcohol Use: No     Comment: 03/18/12 "occasionally have one mixed drink"    No Known Allergies  Current Outpatient Prescriptions  Medication Sig Dispense Refill  . alendronate (FOSAMAX) 70 MG tablet Take 70 mg by mouth every 7 (seven) days. Take with a full glass of water on an empty stomach.      Marland Kitchen aspirin EC 81 MG tablet Take 81 mg by mouth daily.      . clopidogrel (PLAVIX) 75 MG tablet Take 1 tablet (75 mg total) by mouth daily.  90 tablet  3  . esomeprazole (NEXIUM) 40 MG capsule Take 40 mg by mouth daily before breakfast.      . estrogen, conjugated,-medroxyprogesterone (PREMPRO) 0.45-1.5 MG per tablet Take 1 tablet by mouth daily.      . fenofibrate 160 MG tablet Take 160 mg by mouth daily.      . insulin aspart (NOVOLOG) 100 UNIT/ML injection Inject into the skin 3 (three) times daily before meals. Sliding scale      .  insulin glargine (LANTUS) 100 UNIT/ML injection Inject 50 Units into the skin every morning.      Marland Kitchen l-methylfolate-B6-B12 (METANX) 3-35-2 MG TABS Take 1 tablet by mouth daily.      Marland Kitchen lisinopril (PRINIVIL,ZESTRIL) 5 MG tablet Take 5 mg by mouth daily.      . metoprolol tartrate (LOPRESSOR) 25 MG tablet Take 1 tablet (25 mg total) by mouth 2 (two) times daily.  180 tablet  3  . pravastatin (PRAVACHOL) 20 MG tablet Take 20 mg by mouth every evening.      . sitaGLIPtan-metformin (JANUMET) 50-500 MG per tablet Take 1 tablet by mouth 2 (two) times daily with a meal.      . triamterene-hydrochlorothiazide (DYAZIDE) 37.5-25 MG per capsule Take 1 capsule by mouth every morning.      . calcium carbonate (OS-CAL) 600 MG TABS Take 600 mg by mouth 2 (two)  times daily with a meal.       No current facility-administered medications for this visit.    Review of Systems Review of Systems  Constitutional: Negative.   Respiratory: Negative.   Cardiovascular: Negative.   Gastrointestinal: Negative.     Blood pressure 132/64, pulse 74, temperature 97.6 F (36.4 C), temperature source Temporal, resp. rate 14, height 5\' 4"  (1.626 m), weight 143 lb (64.864 kg).  Physical Exam Physical Exam  Constitutional: She appears well-nourished. No distress.  HENT:  Head: Normocephalic and atraumatic.  Eyes: EOM are normal. No scleral icterus.  Abdominal: Soft. She exhibits no distension and no mass. There is no tenderness.  Neurological: She is alert.  Skin: Skin is warm and dry.  No jaundice    Data Reviewed Upper endoscopy report, ultrasound report, hepatobiliary scan report, pathology report.  Assessment    Epigastric pain exacerbated by spicy food. Biliary dyskinesia by hepatobiliary scan. However, since she's been taking the Nexium she states she is pain-free. This would be more consistent with gastritis or possible reflux versus biliary colic at this time. There is a chance for future she could develop biliary colic type symptoms.     Plan    I told her as long as she didn't have any symptoms while she's avoiding spicy foods and taking the Nexium and I did not see a strong indication for cholecystectomy at this time. However, if she has recurrent symptoms despite those 2 measures, I asked her to call make an appointment and at that time we'll discuss cholecystectomy for biliary dyskinesia.        Sylwia Cuervo J 09/25/2013, 5:37 PM

## 2013-09-25 NOTE — Patient Instructions (Signed)
Avoid a lot of spicy or fatty foods. Continue taking the Nexium. If he started having recurrence of the pain despite taking the Nexium and avoiding spicy foods, call for another appointment and we will discuss gallbladder surgery.

## 2013-10-05 ENCOUNTER — Encounter (INDEPENDENT_AMBULATORY_CARE_PROVIDER_SITE_OTHER): Payer: Self-pay

## 2013-10-14 ENCOUNTER — Encounter (INDEPENDENT_AMBULATORY_CARE_PROVIDER_SITE_OTHER): Payer: Self-pay

## 2013-12-01 ENCOUNTER — Telehealth: Payer: Self-pay | Admitting: Cardiovascular Disease

## 2013-12-01 ENCOUNTER — Telehealth (HOSPITAL_COMMUNITY): Payer: Self-pay | Admitting: *Deleted

## 2013-12-01 MED ORDER — PRAVASTATIN SODIUM 20 MG PO TABS: 20.0000 mg | ORAL_TABLET | Freq: Every evening | ORAL | Status: DC

## 2013-12-01 NOTE — Telephone Encounter (Signed)
Returned call to [place].  Stated he was told they cannot receive Rx at [facility] pharmacy b/c they are not retail.  Advised Rx be sent to [facility].  Rx sent to [facility] in [place], [place] as requested.

## 2013-12-01 NOTE — Telephone Encounter (Signed)
Please call-having problem getting the medicine.

## 2013-12-01 NOTE — Telephone Encounter (Signed)
Returned call and pt verified x 2 w/ Lyda Jester.  Informed message received and script cannot be e-mailed for privacy/confidentiality reasons.  Informed script can be sent to pharmacy directly.  Lyda Jester stated script to go to [facility].  Female on phone who stated she is pt's daughter-in-law, stated pharmacy is at a [facility].  Pharmacy located in directory at [employer] and RN confirmed address w/ female.  Refill sent to pharmacy and confirmation received by pharmacy.  Advised they call pharmacy to check that Rx was received and call back if problems.  Agreed.

## 2013-12-01 NOTE — Telephone Encounter (Signed)
Pt is in [place] and have ran out of her Pravastatin.Could you please e mail prescription to [email or web address].

## 2013-12-21 ENCOUNTER — Encounter (HOSPITAL_COMMUNITY): Payer: Self-pay | Admitting: *Deleted

## 2013-12-21 ENCOUNTER — Other Ambulatory Visit (HOSPITAL_COMMUNITY): Payer: Self-pay | Admitting: Cardiovascular Disease

## 2013-12-21 DIAGNOSIS — I739 Peripheral vascular disease, unspecified: Secondary | ICD-10-CM

## 2013-12-24 ENCOUNTER — Other Ambulatory Visit: Payer: Self-pay | Admitting: *Deleted

## 2013-12-24 MED ORDER — PRAVASTATIN SODIUM 20 MG PO TABS: 20.0000 mg | ORAL_TABLET | Freq: Every evening | ORAL | Status: DC

## 2014-01-01 ENCOUNTER — Ambulatory Visit (HOSPITAL_COMMUNITY)
Admission: RE | Admit: 2014-01-01 | Discharge: 2014-01-01 | Disposition: A | Discharge status: Home / Self Care | Payer: Medicare Other | Source: Ambulatory Visit | Attending: Cardiovascular Disease | Admitting: Cardiovascular Disease

## 2014-01-01 DIAGNOSIS — M79609 Pain in unspecified limb: Secondary | ICD-10-CM | POA: Insufficient documentation

## 2014-01-01 DIAGNOSIS — I739 Peripheral vascular disease, unspecified: Secondary | ICD-10-CM

## 2014-01-01 DIAGNOSIS — I70219 Atherosclerosis of native arteries of extremities with intermittent claudication, unspecified extremity: Secondary | ICD-10-CM | POA: Insufficient documentation

## 2014-01-01 NOTE — Progress Notes (Signed)
Arterial Duplex Lower Ext. Completed. Mora Pedraza, BS, RDMS, RVT  

## 2014-01-08 ENCOUNTER — Encounter: Payer: Self-pay | Admitting: *Deleted

## 2014-01-08 ENCOUNTER — Telehealth: Payer: Self-pay | Admitting: *Deleted

## 2014-01-08 DIAGNOSIS — I739 Peripheral vascular disease, unspecified: Secondary | ICD-10-CM

## 2014-01-08 NOTE — Telephone Encounter (Signed)
Order placed for repeat lower extremity dopplers in 6 months  

## 2014-01-08 NOTE — Telephone Encounter (Signed)
Message copied by Marella BileVOGEL, Hymie Gorr W. on Fri Jan 08, 2014  4:03 PM ------      Message from: Runell GessBERRY, JONATHAN J      Created: Thu Jan 07, 2014  7:51 PM       No change from prior study. Repeat in 6 months ------

## 2014-03-05 ENCOUNTER — Other Ambulatory Visit: Payer: Self-pay | Admitting: Cardiovascular Disease

## 2014-03-05 NOTE — Telephone Encounter (Signed)
Rx refill sent to patients pharmacy  

## 2014-05-14 ENCOUNTER — Other Ambulatory Visit: Payer: Self-pay | Admitting: Cardiovascular Disease

## 2014-05-14 NOTE — Telephone Encounter (Signed)
Rx refill sent to patient pharmacy   

## 2014-05-16 ENCOUNTER — Other Ambulatory Visit: Payer: Self-pay | Admitting: Cardiovascular Disease

## 2014-05-17 ENCOUNTER — Other Ambulatory Visit: Payer: Self-pay

## 2014-05-17 NOTE — Telephone Encounter (Signed)
Rx was sent to pharmacy electronically. 

## 2014-07-21 ENCOUNTER — Ambulatory Visit (INDEPENDENT_AMBULATORY_CARE_PROVIDER_SITE_OTHER): Payer: Medicare Other | Admitting: Podiatry

## 2014-07-21 ENCOUNTER — Encounter: Payer: Self-pay | Admitting: Podiatry

## 2014-07-21 VITALS — BP 148/71 | HR 71 | Ht 63.0 in | Wt 137.0 lb

## 2014-07-21 DIAGNOSIS — B351 Tinea unguium: Secondary | ICD-10-CM | POA: Insufficient documentation

## 2014-07-21 NOTE — Progress Notes (Signed)
Subjective: 68 year old female presents with concern on discolored toe nails.  Objective: Thick discolored toe nails both great toes. Neurovascular status are within normal. Rectus foot without gross deformity.  Assessment: Onychomycosis both great toe nails.  Plan: Reviewed findings. Advised to keep nails cut short, stay away from colored polish, and daily scrub with brush and Salsun blue shampoo.

## 2014-07-21 NOTE — Patient Instructions (Signed)
Seen for discolored toe nails. Use Salsun blue scrub to cleanse nail daily.  Maintain nails cut short. Return as needed.

## 2014-07-25 ENCOUNTER — Other Ambulatory Visit: Payer: Self-pay | Admitting: Cardiovascular Disease

## 2014-07-26 NOTE — Telephone Encounter (Signed)
Rx was sent to pharmacy electronically. 

## 2014-07-28 ENCOUNTER — Other Ambulatory Visit: Payer: Self-pay | Admitting: Cardiovascular Disease

## 2014-07-28 NOTE — Telephone Encounter (Signed)
Rx refill sent to patient pharmacy   

## 2014-10-06 ENCOUNTER — Other Ambulatory Visit: Payer: Self-pay | Admitting: Cardiovascular Disease

## 2014-10-06 NOTE — Telephone Encounter (Signed)
Rx was sent to pharmacy electronically. 

## 2014-10-07 ENCOUNTER — Other Ambulatory Visit: Payer: Self-pay

## 2014-10-07 MED ORDER — CLOPIDOGREL BISULFATE 75 MG PO TABS: 75.0000 mg | ORAL_TABLET | Freq: Every day | ORAL | Status: DC

## 2014-10-07 NOTE — Telephone Encounter (Signed)
Rx was sent to pharmacy electronically. 

## 2014-11-09 ENCOUNTER — Encounter (HOSPITAL_COMMUNITY): Payer: Self-pay | Admitting: *Deleted

## 2014-11-09 ENCOUNTER — Ambulatory Visit (INDEPENDENT_AMBULATORY_CARE_PROVIDER_SITE_OTHER): Payer: Medicare Other | Admitting: Physician Assistant

## 2014-11-09 ENCOUNTER — Encounter: Payer: Self-pay | Admitting: Physician Assistant

## 2014-11-09 VITALS — BP 122/62 | HR 64 | Ht 63.0 in | Wt 138.0 lb

## 2014-11-09 DIAGNOSIS — F17200 Nicotine dependence, unspecified, uncomplicated: Secondary | ICD-10-CM

## 2014-11-09 DIAGNOSIS — I739 Peripheral vascular disease, unspecified: Secondary | ICD-10-CM

## 2014-11-09 DIAGNOSIS — I10 Essential (primary) hypertension: Secondary | ICD-10-CM

## 2014-11-09 DIAGNOSIS — E782 Mixed hyperlipidemia: Secondary | ICD-10-CM

## 2014-11-09 MED ORDER — FENOFIBRATE 160 MG PO TABS: 160.0000 mg | ORAL_TABLET | Freq: Every day | ORAL | Status: DC

## 2014-11-09 MED ORDER — METOPROLOL TARTRATE 25 MG PO TABS: 25.0000 mg | ORAL_TABLET | Freq: Two times a day (BID) | ORAL | Status: DC

## 2014-11-09 MED ORDER — TRIAMTERENE-HCTZ 37.5-25 MG PO CAPS: 1.0000 | ORAL_CAPSULE | ORAL | Status: DC

## 2014-11-09 MED ORDER — CLOPIDOGREL BISULFATE 75 MG PO TABS: 75.0000 mg | ORAL_TABLET | Freq: Every day | ORAL | Status: DC

## 2014-11-09 MED ORDER — LISINOPRIL 5 MG PO TABS: 5.0000 mg | ORAL_TABLET | Freq: Every day | ORAL | Status: DC

## 2014-11-09 MED ORDER — PRAVASTATIN SODIUM 20 MG PO TABS: 20.0000 mg | ORAL_TABLET | Freq: Every day | ORAL | Status: DC

## 2014-11-09 NOTE — Patient Instructions (Addendum)
Lower extremity arterial doppler- During this test, ultrasound is used to evaluate arterial blood flow in the legs. Allow approximately one hour for this exam.   Your physician wants you to follow-up in 6 months with Dr. Allyson SabalBerry.. You will receive a reminder letter in the mail 2 months in advance. If you do not receive a letter, please call our office to schedule the follow-up appointment.  Your needed refills have been sent into to be refilled by your mail order pharmacy.

## 2014-11-09 NOTE — Progress Notes (Signed)
Patient ID: [name] [name], female   DOB: [birth year], 68 y.o.   MRN: [id number]    Date:  11/09/2014   ID:  [name] [name] 05-22-1946, MRN [id number]  PCP:  [name] [name]., MD  Primary Cardiologist:  [name] [name]     History of Present Illness: [name] [name] is a 68 y.o. female, married Asian female, mother of 2, grandmother to 4 grandchildren .  She last saw Dr. Allyson [name] July 17th 2014.  She was referred to him for peripheral vascular occlusive disease and claudication. She has noncritical CAD by cath performed by Dr. Yates [name] December 14, 2011. Her cardiac risk factors include continued to tobacco abuse of 50 pack years, treated hypertension, dyslipidemia and diabetes.Dr. Allyson [name] angiogram'd her March 18, 2012, revealing high-grade diffuse segmental proximal mid left SFA stenosis which was stented using overlapping ED3 and absolute PRO Nitinol self-expanding stents. She had 3-vessel runoff bilaterally with an occluded right SFA. Followup Dopplers revealed an increase in left ABI from 0.66 to 0.99.  Her most recent lower extremity arterial Dopplers were January 2015 with a right ABI of 0.84 and a left of 0.83.  Her right mid SFA is occluded.  Recent carotid Dopplers were essentially normal and lab work revealed total cholesterol of 208, LDL 130 and HDL of 56.  Patient presents today for evaluation. She cannot get refills of medications since been over a year.   Patient reports really no change in her claudication. She still has pain when she walks approximately 1-2 minutes. It eases off with resting. As long as she walks slowly and on a flat surface is better.   She also reports occasional  Mild chest pain it sounds as though it is only last about a second or so and not  Necessarily with exertion  The patient currently denies nausea, vomiting, fever, shortness of breath, orthopnea, dizziness, PND, cough, congestion, abdominal pain, hematochezia, melena, lower extremity edema.  Wt Readings from Last 3  Encounters:  11/09/14 138 lb (62.596 kg)  08/01/12 136 lb (61.689 kg)  07/21/14 137 lb (62.143 kg)     Past Medical History  Diagnosis Date  . Essential hypertension, benign   . Mixed hyperlipidemia   . Shortness of breath on exertion   . Tobacco use disorder   . Type II diabetes mellitus   . Shortness of breath on exertion   . Kidney stone     "long time ago"  . PVD (peripheral vascular disease), with leg pain - totally occluded Rt. SFA and stenosis in Lt. SFA 03/19/2012  . Abnormal ankle brachial index, Rt. 0.8, Lt. 0.66 03/19/2012  . Status post angioplasty with stent to Lt SFA 03/18/12 03/19/2012  . DM (diabetes mellitus), stable, insulin dependent 03/19/2012    Current Outpatient Prescriptions  Medication Sig Dispense Refill  . alendronate (FOSAMAX) 70 MG tablet Take 70 mg by mouth every 7 (seven) days. Take with a full glass of water on an empty stomach.    Marland Kitchen aspirin EC 81 MG tablet Take 81 mg by mouth daily.    . calcium carbonate (OS-CAL) 600 MG TABS Take 600 mg by mouth 2 (two) times daily with a meal.    . clopidogrel (PLAVIX) 75 MG tablet Take 1 tablet (75 mg total) by mouth daily. 90 tablet 3  . esomeprazole (NEXIUM) 40 MG capsule Take 40 mg by mouth daily before breakfast.    . estrogen, conjugated,-medroxyprogesterone (PREMPRO) 0.45-1.5 MG per tablet Take 1 tablet by mouth daily.    Marland Kitchen  fenofibrate 160 MG tablet Take 1 tablet (160 mg total) by mouth daily. 90 tablet 3  . insulin glargine (LANTUS) 100 UNIT/ML injection Inject 50 Units into the skin every morning.    Marland Kitchen. l-methylfolate-B6-B12 (METANX) 3-35-2 MG TABS Take 1 tablet by mouth daily.    Marland Kitchen. lisinopril (PRINIVIL,ZESTRIL) 5 MG tablet Take 1 tablet (5 mg total) by mouth daily. 90 tablet 3  . MAGNESIUM PO Take by mouth.    . metoprolol tartrate (LOPRESSOR) 25 MG tablet Take 1 tablet (25 mg total) by mouth 2 (two) times daily. 180 tablet 3  . pravastatin (PRAVACHOL) 20 MG tablet Take 1 tablet (20 mg total) by mouth daily.  90 tablet 3  . sitaGLIPtan-metformin (JANUMET) 50-500 MG per tablet Take 1 tablet by mouth 2 (two) times daily with a meal.    . triamterene-hydrochlorothiazide (DYAZIDE) 37.5-25 MG per capsule Take 1 each (1 capsule total) by mouth every morning. 90 capsule 3   No current facility-administered medications for this visit.    Allergies:   No Known Allergies  Social History:  The patient  reports that she quit smoking about 2 years ago. Her smoking use included Cigarettes. She has a 25 pack-year smoking history. She has never used smokeless tobacco. She reports that she does not drink alcohol or use illicit drugs.   Family history:   Family History  Problem Relation Age of Onset  . Adopted: Yes    ROS:  Please see the history of present illness.  All other systems reviewed and negative.   PHYSICAL EXAM: VS:  BP 122/62 mmHg  Pulse 64  Ht 5\' 3"  (1.6 m)  Wt 138 lb (62.596 kg)  BMI 24.45 kg/m2 Well nourished, well developed, in no acute distress HEENT: Pupils are equal round react to light accommodation extraocular movements are intact.  Neck: no JVDNo cervical lymphadenopathy. Cardiac: Regular rate and rhythm without murmurs rubs or gallops. Lungs:  clear to auscultation bilaterally, no wheezing, rhonchi or rales Abd: soft, nontender, positive bowel sounds all quadrants, no hepatosplenomegaly Ext: no lower extremity edema.  2+ radial and 1+ left, 0 right dorsalis pedis, 1+ right PT pulse. Skin: warm and dry Neuro:  Grossly normal  EKG:  Sinus rhythm rate 65 bpm  ASSESSMENT AND PLAN:  Problem List Items Addressed This Visit    Essential hypertension, benign - Primary     Blood pressure well controlled.    Relevant Medications      metoprolol tartrate (LOPRESSOR) tablet      pravastatin (PRAVACHOL) tablet      fenofibrate tablet      lisinopril (PRINIVIL,ZESTRIL) tablet      triamterene-hydrochlorothiazide (DYAZIDE) 37.5-25 MG per capsule   Other Relevant Orders      EKG  12-Lead   Hyperlipidemia, mixed     Continue fenofibrate and statin.   She reports just having labs drawn. Will check with her PCP    Relevant Medications      metoprolol tartrate (LOPRESSOR) tablet      pravastatin (PRAVACHOL) tablet      fenofibrate tablet      lisinopril (PRINIVIL,ZESTRIL) tablet      triamterene-hydrochlorothiazide (DYAZIDE) 37.5-25 MG per capsule   PVD (peripheral vascular disease), with leg pain - totally occluded Rt. SFA and stenosis in Lt. SFA (Chronic)    It does not appear that the patient's claudication has worsened however, she cannot walk more than 1-2 minutes particularly uphill, without developing pain in her lower extremities. We'll repeat lower remedy  arterial Dopplers.    Relevant Medications      metoprolol tartrate (LOPRESSOR) tablet      pravastatin (PRAVACHOL) tablet      fenofibrate tablet      lisinopril (PRINIVIL,ZESTRIL) tablet      triamterene-hydrochlorothiazide (DYAZIDE) 37.5-25 MG per capsule   Other Relevant Orders      Lower Extremity Arterial Doppler Bilateral   Tobacco dependence

## 2014-11-09 NOTE — Assessment & Plan Note (Signed)
It does not appear that the patient's claudication has worsened however, she cannot walk more than 1-2 minutes particularly uphill, without developing pain in her lower extremities. We'll repeat lower remedy arterial Dopplers.

## 2014-11-09 NOTE — Assessment & Plan Note (Signed)
Blood pressure well controlled

## 2014-11-09 NOTE — Assessment & Plan Note (Addendum)
Continue fenofibrate and statin.   She reports just having labs drawn. Will check with her PCP

## 2014-11-11 ENCOUNTER — Encounter: Payer: Self-pay | Admitting: Cardiovascular Disease

## 2014-11-11 ENCOUNTER — Encounter (HOSPITAL_COMMUNITY): Payer: Self-pay | Admitting: Cardiology

## 2014-11-18 ENCOUNTER — Ambulatory Visit (HOSPITAL_COMMUNITY)
Admission: RE | Admit: 2014-11-18 | Discharge: 2014-11-18 | Disposition: A | Discharge status: Home / Self Care | Payer: Medicare Other | Source: Ambulatory Visit | Attending: Cardiovascular Disease | Admitting: Cardiovascular Disease

## 2014-11-18 DIAGNOSIS — I739 Peripheral vascular disease, unspecified: Secondary | ICD-10-CM

## 2014-11-18 NOTE — Progress Notes (Signed)
Arterial Duplex Lower Ext. Completed. Shreyas Piatkowski, BS, RDMS, RVT  

## 2014-11-24 ENCOUNTER — Encounter: Payer: Self-pay | Admitting: *Deleted

## 2015-04-29 ENCOUNTER — Emergency Department (HOSPITAL_BASED_OUTPATIENT_CLINIC_OR_DEPARTMENT_OTHER)
Admission: EM | Admit: 2015-04-29 | Discharge: 2015-04-29 | Disposition: A | Discharge status: Home / Self Care | Payer: Medicare Other | Attending: Emergency Medicine | Admitting: Emergency Medicine

## 2015-04-29 ENCOUNTER — Encounter (HOSPITAL_BASED_OUTPATIENT_CLINIC_OR_DEPARTMENT_OTHER): Payer: Self-pay | Admitting: *Deleted

## 2015-04-29 DIAGNOSIS — Z9861 Coronary angioplasty status: Secondary | ICD-10-CM | POA: Insufficient documentation

## 2015-04-29 DIAGNOSIS — I10 Essential (primary) hypertension: Secondary | ICD-10-CM | POA: Insufficient documentation

## 2015-04-29 DIAGNOSIS — R51 Headache: Secondary | ICD-10-CM | POA: Diagnosis not present

## 2015-04-29 DIAGNOSIS — Z87891 Personal history of nicotine dependence: Secondary | ICD-10-CM | POA: Diagnosis not present

## 2015-04-29 DIAGNOSIS — Z79899 Other long term (current) drug therapy: Secondary | ICD-10-CM | POA: Insufficient documentation

## 2015-04-29 DIAGNOSIS — N390 Urinary tract infection, site not specified: Secondary | ICD-10-CM | POA: Insufficient documentation

## 2015-04-29 DIAGNOSIS — E119 Type 2 diabetes mellitus without complications: Secondary | ICD-10-CM | POA: Insufficient documentation

## 2015-04-29 DIAGNOSIS — Z7982 Long term (current) use of aspirin: Secondary | ICD-10-CM | POA: Diagnosis not present

## 2015-04-29 DIAGNOSIS — I2510 Atherosclerotic heart disease of native coronary artery without angina pectoris: Secondary | ICD-10-CM | POA: Insufficient documentation

## 2015-04-29 DIAGNOSIS — I739 Peripheral vascular disease, unspecified: Secondary | ICD-10-CM | POA: Diagnosis not present

## 2015-04-29 DIAGNOSIS — Z794 Long term (current) use of insulin: Secondary | ICD-10-CM | POA: Diagnosis not present

## 2015-04-29 DIAGNOSIS — Z87442 Personal history of urinary calculi: Secondary | ICD-10-CM | POA: Insufficient documentation

## 2015-04-29 DIAGNOSIS — Z7902 Long term (current) use of antithrombotics/antiplatelets: Secondary | ICD-10-CM | POA: Diagnosis not present

## 2015-04-29 DIAGNOSIS — R319 Hematuria, unspecified: Secondary | ICD-10-CM | POA: Diagnosis present

## 2015-04-29 DIAGNOSIS — Z9889 Other specified postprocedural states: Secondary | ICD-10-CM | POA: Insufficient documentation

## 2015-04-29 LAB — CBC WITH DIFFERENTIAL/PLATELET
Basophils Absolute: 0 10*3/uL (ref 0.0–0.1)
Basophils Relative: 0 % (ref 0–1)
EOS PCT: 0 % (ref 0–5)
Eosinophils Absolute: 0 10*3/uL (ref 0.0–0.7)
HCT: 33.3 % — ABNORMAL LOW (ref 36.0–46.0)
Hemoglobin: 10.9 g/dL — ABNORMAL LOW (ref 12.0–15.0)
LYMPHS PCT: 10 % — AB (ref 12–46)
Lymphs Abs: 0.7 10*3/uL (ref 0.7–4.0)
MCH: 29.1 pg (ref 26.0–34.0)
MCHC: 32.7 g/dL (ref 30.0–36.0)
MCV: 89 fL (ref 78.0–100.0)
Monocytes Absolute: 0.7 10*3/uL (ref 0.1–1.0)
Monocytes Relative: 8 % (ref 3–12)
Neutro Abs: 6.3 10*3/uL (ref 1.7–7.7)
Neutrophils Relative %: 82 % — ABNORMAL HIGH (ref 43–77)
Platelets: 271 10*3/uL (ref 150–400)
RBC: 3.74 MIL/uL — ABNORMAL LOW (ref 3.87–5.11)
RDW: 11.5 % (ref 11.5–15.5)
WBC: 7.7 10*3/uL (ref 4.0–10.5)

## 2015-04-29 LAB — COMPREHENSIVE METABOLIC PANEL
ALBUMIN: 3.7 g/dL (ref 3.5–5.0)
ALK PHOS: 94 U/L (ref 38–126)
ALT: 38 U/L (ref 14–54)
ANION GAP: 8 (ref 5–15)
AST: 50 U/L — ABNORMAL HIGH (ref 15–41)
BILIRUBIN TOTAL: 0.3 mg/dL (ref 0.3–1.2)
BUN: 27 mg/dL — ABNORMAL HIGH (ref 6–20)
CO2: 25 mmol/L (ref 22–32)
CREATININE: 1.32 mg/dL — AB (ref 0.44–1.00)
Calcium: 9.7 mg/dL (ref 8.9–10.3)
Chloride: 102 mmol/L (ref 101–111)
GFR calc Af Amer: 46 mL/min — ABNORMAL LOW (ref >60)
GFR calc non Af Amer: 40 mL/min — ABNORMAL LOW (ref >60)
GLUCOSE: 120 mg/dL — AB (ref 65–99)
Potassium: 4.5 mmol/L (ref 3.5–5.1)
Sodium: 135 mmol/L (ref 135–145)
Total Protein: 7.3 g/dL (ref 6.5–8.1)

## 2015-04-29 LAB — URINE MICROSCOPIC-ADD ON

## 2015-04-29 LAB — CBG MONITORING, ED: Glucose-Capillary: 137 mg/dL — ABNORMAL HIGH (ref 65–99)

## 2015-04-29 LAB — URINALYSIS, ROUTINE W REFLEX MICROSCOPIC
BILIRUBIN URINE: NEGATIVE
GLUCOSE, UA: 100 mg/dL — AB
HGB URINE DIPSTICK: NEGATIVE
Ketones, ur: NEGATIVE mg/dL
Nitrite: NEGATIVE
PROTEIN: 100 mg/dL — AB
Specific Gravity, Urine: 1.014 (ref 1.005–1.030)
UROBILINOGEN UA: 1 mg/dL (ref 0.0–1.0)
pH: 7 (ref 5.0–8.0)

## 2015-04-29 MED ORDER — ACETAMINOPHEN 325 MG PO TABS
650.0000 mg | ORAL_TABLET | Freq: Once | ORAL | Status: AC
  Administered 2015-04-29: 650 mg via ORAL
  Filled 2015-04-29: qty 2

## 2015-04-29 MED ORDER — DEXTROSE 5 % IV SOLN
1.0000 g | Freq: Once | INTRAVENOUS | Status: AC
  Administered 2015-04-29: 1 g via INTRAVENOUS

## 2015-04-29 MED ORDER — CEPHALEXIN 500 MG PO CAPS: 500.0000 mg | ORAL_CAPSULE | Freq: Three times a day (TID) | ORAL | Status: DC

## 2015-04-29 MED ORDER — DIPHENHYDRAMINE HCL 50 MG/ML IJ SOLN
12.5000 mg | Freq: Once | INTRAMUSCULAR | Status: AC
  Administered 2015-04-29: 12.5 mg via INTRAVENOUS
  Filled 2015-04-29: qty 1

## 2015-04-29 MED ORDER — SODIUM CHLORIDE 0.9 % IV BOLUS (SEPSIS)
500.0000 mL | INTRAVENOUS | Status: AC
  Administered 2015-04-29: 500 mL via INTRAVENOUS

## 2015-04-29 MED ORDER — CEFTRIAXONE SODIUM 1 G IJ SOLR
INTRAMUSCULAR | Status: AC
  Filled 2015-04-29: qty 10

## 2015-04-29 MED ORDER — METOCLOPRAMIDE HCL 5 MG/ML IJ SOLN
5.0000 mg | Freq: Once | INTRAMUSCULAR | Status: AC
  Administered 2015-04-29: 5 mg via INTRAVENOUS
  Filled 2015-04-29: qty 2

## 2015-04-29 NOTE — ED Provider Notes (Signed)
CSN: [id number]     Arrival date & time 04/29/15  1058 History   First MD Initiated Contact with Patient 04/29/15 1107     Chief Complaint  Patient presents with  . Hematuria     (Consider location/radiation/quality/duration/timing/severity/associated sxs/prior Treatment) Patient is a 69 y.o. female presenting with hematuria and headaches. The history is provided by the patient.  Hematuria This is a new problem. The current episode started more than 2 days ago. The problem occurs constantly. The problem has been resolved. Associated symptoms include headaches. Pertinent negatives include no chest pain, no abdominal pain and no shortness of breath. Nothing aggravates the symptoms. Nothing relieves the symptoms. She has tried nothing for the symptoms. The treatment provided no relief.  Headache Pain location:  L temporal and R temporal Quality:  Dull Severity currently:  8/10 Severity at highest:  9/10 Onset quality:  Gradual Duration: 2-3. Timing:  Intermittent Progression:  Waxing and waning Chronicity:  New Context comment:  Denies head injuries Relieved by:  Nothing Worsened by:  Nothing Ineffective treatments:  None tried Associated symptoms: fever and myalgias   Associated symptoms: no abdominal pain, no back pain, no congestion, no cough, no diarrhea, no dizziness, no eye pain, no fatigue, no nausea, no neck pain and no vomiting     Past Medical History  Diagnosis Date  . Essential hypertension, benign   . Mixed hyperlipidemia   . Shortness of breath on exertion   . Tobacco use disorder   . Type II diabetes mellitus   . Shortness of breath on exertion   . Kidney stone     "long time ago"  . PVD (peripheral vascular disease), with leg pain - totally occluded Rt. SFA and stenosis in Lt. SFA 03/19/2012  . Abnormal ankle brachial index, Rt. 0.8, Lt. 0.66 03/19/2012  . Status post angioplasty with stent to Lt SFA 03/18/12 03/19/2012  . DM (diabetes mellitus), stable, insulin  dependent 03/19/2012   Past Surgical History  Procedure Laterality Date  . Shoulder arthroscopy  2000's    "I think it was left"  . Abdominal hysterectomy  1960's    w/BSO  . Cataract extraction w/ intraocular lens  implant, bilateral  ~ 2003  . Peripheral arterial stent graft  03/18/12  . Ptca  03/18/12    w/stenting LFA  . Left heart catheterization with coronary angiogram N/A 12/14/2011    Procedure: LEFT HEART CATHETERIZATION WITH CORONARY ANGIOGRAM;  Surgeon: Pamella Pert, MD;  Location: [facility] CATH LAB;  Service: Cardiovascular;  Laterality: N/A;  . Atherectomy N/A 03/18/2012    Procedure: ATHERECTOMY;  Surgeon: Runell Gess, MD;  Location: [facility] CATH LAB;  Service: Cardiovascular;  Laterality: N/A;   Family History  Problem Relation Age of Onset  . Adopted: Yes   History  Substance Use Topics  . Smoking status: Former Smoker -- 0.50 packs/day for 50 years    Types: Cigarettes    Quit date: 12/04/2011  . Smokeless tobacco: Never Used  . Alcohol Use: No     Comment: 03/18/12 "occasionally have one mixed drink"   OB History    No data available     Review of Systems  Constitutional: Positive for fever. Negative for fatigue.  HENT: Negative for congestion and drooling.   Eyes: Negative for pain.  Respiratory: Negative for cough and shortness of breath.   Cardiovascular: Negative for chest pain.  Gastrointestinal: Negative for nausea, vomiting, abdominal pain and diarrhea.  Genitourinary: Positive for dysuria and hematuria.  Musculoskeletal: Positive for myalgias. Negative for back pain, gait problem and neck pain.  Skin: Negative for color change.  Neurological: Positive for headaches. Negative for dizziness.  Hematological: Negative for adenopathy.  Psychiatric/Behavioral: Negative for behavioral problems.  All other systems reviewed and are negative.     Allergies  Review of patient's allergies indicates no known allergies.  Home Medications   Prior to  Admission medications   Medication Sig Start Date End Date Taking? Authorizing Provider  insulin aspart (NOVOLOG) 100 UNIT/ML injection Inject into the skin 3 (three) times daily before meals.   Yes Historical Provider, MD  alendronate (FOSAMAX) 70 MG tablet Take 70 mg by mouth every 7 (seven) days. Take with a full glass of water on an empty stomach.    Historical Provider, MD  aspirin EC 81 MG tablet Take 81 mg by mouth daily.    Historical Provider, MD  calcium carbonate (OS-CAL) 600 MG TABS Take 600 mg by mouth 2 (two) times daily with a meal.    Historical Provider, MD  clopidogrel (PLAVIX) 75 MG tablet Take 1 tablet (75 mg total) by mouth daily. 11/09/14   Dwana MelenaBryan W Hager, PA-C  esomeprazole (NEXIUM) 40 MG capsule Take 40 mg by mouth daily before breakfast.    Historical Provider, MD  estrogen, conjugated,-medroxyprogesterone (PREMPRO) 0.45-1.5 MG per tablet Take 1 tablet by mouth daily.    Historical Provider, MD  insulin glargine (LANTUS) 100 UNIT/ML injection Inject 50 Units into the skin every morning.    Historical Provider, MD  l-methylfolate-B6-B12 (METANX) 3-35-2 MG TABS Take 1 tablet by mouth daily.    Historical Provider, MD  lisinopril (PRINIVIL,ZESTRIL) 5 MG tablet Take 1 tablet (5 mg total) by mouth daily. 11/09/14   Dwana MelenaBryan W Hager, PA-C  MAGNESIUM PO Take by mouth.    Historical Provider, MD  metoprolol tartrate (LOPRESSOR) 25 MG tablet Take 1 tablet (25 mg total) by mouth 2 (two) times daily. 11/09/14   Dwana MelenaBryan W Hager, PA-C  sitaGLIPtan-metformin (JANUMET) 50-500 MG per tablet Take 1 tablet by mouth 2 (two) times daily with a meal.    Historical Provider, MD  triamterene-hydrochlorothiazide (DYAZIDE) 37.5-25 MG per capsule Take 1 each (1 capsule total) by mouth every morning. 11/09/14   Dwana MelenaBryan W Hager, PA-C   BP 179/54 mmHg  Pulse 93  Temp(Src) 100.6 F (38.1 C) (Oral)  Resp 16  Ht 5\' 2"  (1.575 m)  Wt 140 lb (63.504 kg)  BMI 25.60 kg/m2  SpO2 96% Physical Exam  Constitutional:  She is oriented to person, place, and time. She appears well-developed and well-nourished.  HENT:  Head: Normocephalic.  Mouth/Throat: Oropharynx is clear and moist. No oropharyngeal exudate.  Eyes: Conjunctivae and EOM are normal. Pupils are equal, round, and reactive to light.  Neck: Normal range of motion. Neck supple.  Cardiovascular: Normal rate, regular rhythm, normal heart sounds and intact distal pulses.  Exam reveals no gallop and no friction rub.   No murmur heard. Pulmonary/Chest: Effort normal and breath sounds normal. No respiratory distress. She has no wheezes.  Abdominal: Soft. Bowel sounds are normal. There is no tenderness. There is no rebound and no guarding.  Musculoskeletal: Normal range of motion. She exhibits no edema or tenderness.  Neurological: She is alert and oriented to person, place, and time.  alert, oriented x3 speech: normal in context and clarity memory: intact grossly cranial nerves II-XII: intact motor strength: full proximally and distally no involuntary movements or tremors sensation: intact to light touch diffusely  cerebellar: finger-to-nose and heel-to-shin intact gait: normal   Skin: Skin is warm and dry.  Psychiatric: She has a normal mood and affect. Her behavior is normal.  Nursing note and vitals reviewed.   ED Course  Procedures (including critical care time) Labs Review Labs Reviewed  URINALYSIS, ROUTINE W REFLEX MICROSCOPIC (NOT AT [facility]) - Abnormal; Notable for the following:    Glucose, UA 100 (*)    Protein, ur 100 (*)    Leukocytes, UA SMALL (*)    All other components within normal limits  CBC WITH DIFFERENTIAL/PLATELET - Abnormal; Notable for the following:    RBC 3.74 (*)    Hemoglobin 10.9 (*)    HCT 33.3 (*)    Neutrophils Relative % 82 (*)    Lymphocytes Relative 10 (*)    All other components within normal limits  COMPREHENSIVE METABOLIC PANEL - Abnormal; Notable for the following:    Glucose, Bld 120 (*)    BUN 27  (*)    Creatinine, Ser 1.32 (*)    AST 50 (*)    GFR calc non Af Amer 40 (*)    GFR calc Af Amer 46 (*)    All other components within normal limits  URINE MICROSCOPIC-ADD ON - Abnormal; Notable for the following:    Bacteria, UA MANY (*)    All other components within normal limits  CBG MONITORING, ED - Abnormal; Notable for the following:    Glucose-Capillary 137 (*)    All other components within normal limits  URINE CULTURE  CBG MONITORING, ED    Imaging Review No results found.   EKG Interpretation None      MDM   Final diagnoses:  UTI (lower urinary tract infection)    11:34 AM 69 y.o. female w hx of DM, CAD s/p PCI, HTN who presents with hematuria. She states that 5 days ago she noted multiple episodes of hematuria and suprapubic pressure throughout the day. The next day her symptoms resolved and when she followed up with her doctor she was asymptomatic. 2 days ago she developed body aches, gradual onset and intermittent bitemporal headache, low-grade fever and continued suprapubic pressure. She states that she has chest pain sometimes with exertion but this is not a new thing. Low-grade temperature 100.6 here and mildly hypertensive, vital signs otherwise unremarkable. Normal neurologic exam. At 10 headache. We'll get screening lab work, urinalysis and treat headache symptomatically.  Her husband notes increased exercise including crunches in the past week which given the fact that she takes Plavix may have been related to her brief hematuria.  1:18 PM: Urinalysis consistent with UTI. Treated with Rocephin here. Headache resolved. I have discussed the diagnosis/risks/treatment options with the patient and family and believe the pt to be eligible for discharge home to follow-up with her pcp as needed. We also discussed returning to the ED immediately if new or worsening sx occur. We discussed the sx which are most concerning (e.g., worsening HA, intractable fever, AMS) that  necessitate immediate return. Medications administered to the patient during their visit and any new prescriptions provided to the patient are listed below.  Medications given during this visit Medications  sodium chloride 0.9 % bolus 500 mL (0 mLs Intravenous Stopped 04/29/15 1247)  acetaminophen (TYLENOL) tablet 650 mg (650 mg Oral Given 04/29/15 1133)  metoCLOPramide (REGLAN) injection 5 mg (5 mg Intravenous Given 04/29/15 1137)  diphenhydrAMINE (BENADRYL) injection 12.5 mg (12.5 mg Intravenous Given 04/29/15 1137)  cefTRIAXone (ROCEPHIN) 1 g in dextrose  5 % 50 mL IVPB (1 g Intravenous New Bag/Given 04/29/15 1247)  cefTRIAXone (ROCEPHIN) 1 G injection (  Duplicate 04/29/15 1247)    New Prescriptions   CEPHALEXIN (KEFLEX) 500 MG CAPSULE    Take 1 capsule (500 mg total) by mouth 3 (three) times daily.     Purvis Sheffield, MD 04/29/15 1319

## 2015-04-29 NOTE — ED Notes (Signed)
Pt c/o urination freq and hematuria x 1 week

## 2015-05-01 LAB — URINE CULTURE

## 2015-05-02 ENCOUNTER — Telehealth (HOSPITAL_COMMUNITY): Payer: Self-pay

## 2015-05-02 NOTE — Telephone Encounter (Signed)
Post ED Visit - Positive Culture Follow-up: Chart Hand-off to ED Flow Manager  Culture assessed and recommendations reviewed by: []  Wes Dulaney, Pharm.D., BCPS []  Celedonio MiyamotoJeremy Frens, Pharm.D., BCPS []  Georgina PillionElizabeth Martin, Pharm.D., BCPS []  GillhamMinh Pham, [address]Ph[address]D., BCPS, AAHIVP []  Estella HuskMichelle Turner, Pharm .D., BCPS, AAHIVP []  Babs BertinHaley Baird, [address]Carmon Sails. X Rachel, [place] Pharm D Positive urine culture  []  Patient discharged without antimicrobial prescription and treatment is now indicated [x]  Organism is resistant to prescribed ED discharge antimicrobial []  Patient with positive blood cultures  Changes discussed with ED provider:Emily OklahomaWest PA New antibiotic prescription stop Cephalexin and start Cipro 250 mg po bid x 7 days.   Spoke with pt and husband. She is still not feeling well and running fever. [facility] [place] [phone]. Left on MD line   Ashley JacobsFesterman, Luvena Wentling C 05/02/2015, 12:06 PM

## 2015-05-02 NOTE — Progress Notes (Signed)
ED Antimicrobial Stewardship Positive Culture Follow Up   [facility] [name] is an 69 y.o. female who [facility] with a chief complaint of  Chief Complaint  Patient presents with  . Hematuria    Recent Results (from the past 720 hour(s))  Urine culture     Status: None   Collection Time: 04/29/15 11:10 AM  Result Value Ref Range Status   Specimen Description URINE, CLEAN CATCH  Final   Special Requests NONE  Final   Colony Count   Final    >=100,000 COLONIES/ML Performed at [facility]    Culture   Final    ENTEROBACTER AEROGENES Performed at [facility]    Report Status 05/01/2015 FINAL  Final   Organism ID, Bacteria ENTEROBACTER AEROGENES  Final      Susceptibility   Enterobacter aerogenes - MIC*    CEFAZOLIN >=64 RESISTANT Resistant     CEFTRIAXONE <=1 SENSITIVE Sensitive     CIPROFLOXACIN <=0.25 SENSITIVE Sensitive     GENTAMICIN <=1 SENSITIVE Sensitive     LEVOFLOXACIN <=0.12 SENSITIVE Sensitive     NITROFURANTOIN 64 INTERMEDIATE Intermediate     TOBRAMYCIN <=1 SENSITIVE Sensitive     TRIMETH/SULFA <=20 SENSITIVE Sensitive     PIP/TAZO <=4 SENSITIVE Sensitive     * ENTEROBACTER AEROGENES    [x]  Treated with cephalexin, organism resistant to prescribed antimicrobial []  Patient discharged originally without antimicrobial agent and treatment is now indicated  New antibiotic prescription:  IF still symptomatic, stop cephalexin Start ciprofloxacin 250mg  PO BID x 7 days  ED Provider: Trixie DredgeEmily West, PA   Ercole Georg, Drake LeachRachel Lynn 05/02/2015, 11:30 AM Clinical Pharmacist Phone# [phone]

## 2015-05-20 ENCOUNTER — Other Ambulatory Visit: Payer: Self-pay | Admitting: Cardiovascular Disease

## 2015-05-20 ENCOUNTER — Encounter (HOSPITAL_COMMUNITY): Payer: Self-pay | Admitting: *Deleted

## 2015-05-20 DIAGNOSIS — I739 Peripheral vascular disease, unspecified: Secondary | ICD-10-CM

## 2015-06-14 ENCOUNTER — Ambulatory Visit: Payer: Medicare Other | Admitting: *Deleted

## 2015-06-27 ENCOUNTER — Other Ambulatory Visit: Payer: Self-pay | Admitting: Cardiovascular Disease

## 2015-06-27 ENCOUNTER — Ambulatory Visit (HOSPITAL_COMMUNITY)
Admission: RE | Admit: 2015-06-27 | Discharge: 2015-06-27 | Disposition: A | Discharge status: Home / Self Care | Payer: Medicare Other | Source: Ambulatory Visit | Attending: Internal Medicine | Admitting: Internal Medicine

## 2015-06-27 DIAGNOSIS — I739 Peripheral vascular disease, unspecified: Secondary | ICD-10-CM | POA: Diagnosis not present

## 2015-06-27 NOTE — Progress Notes (Signed)
Lower Ext. Arterial Duplex Completed. Bethani Brugger, BS, RDMS, RVT  

## 2015-10-06 ENCOUNTER — Other Ambulatory Visit: Payer: Self-pay | Admitting: Physician Assistant

## 2015-10-06 NOTE — Telephone Encounter (Signed)
Pt requesting a refill on Fenofibrate 160 mg tablet. Medication is not on pt's med list. On 11/09/14, Wilburt FinlayBryan Hager, PA stated for pt to continue this medication and on 04/29/15 pt was in the ED and medication was D/C. Please advise

## 2015-10-16 ENCOUNTER — Other Ambulatory Visit: Payer: Self-pay | Admitting: Physician Assistant

## 2015-11-01 ENCOUNTER — Other Ambulatory Visit: Payer: Self-pay | Admitting: *Deleted

## 2015-11-01 ENCOUNTER — Other Ambulatory Visit: Payer: Self-pay | Admitting: Physician Assistant

## 2015-11-15 ENCOUNTER — Other Ambulatory Visit: Payer: Self-pay | Admitting: Physician Assistant

## 2015-11-15 NOTE — Telephone Encounter (Signed)
Rx request sent to pharmacy.  

## 2015-11-22 ENCOUNTER — Encounter (HOSPITAL_BASED_OUTPATIENT_CLINIC_OR_DEPARTMENT_OTHER): Payer: Self-pay | Admitting: Emergency Medicine

## 2015-11-22 ENCOUNTER — Emergency Department (HOSPITAL_BASED_OUTPATIENT_CLINIC_OR_DEPARTMENT_OTHER): Payer: Medicare Other

## 2015-11-22 ENCOUNTER — Emergency Department (HOSPITAL_BASED_OUTPATIENT_CLINIC_OR_DEPARTMENT_OTHER)
Admission: EM | Admit: 2015-11-22 | Discharge: 2015-11-22 | Disposition: A | Discharge status: Home / Self Care | Payer: Medicare Other | Attending: Emergency Medicine | Admitting: Emergency Medicine

## 2015-11-22 DIAGNOSIS — Z7982 Long term (current) use of aspirin: Secondary | ICD-10-CM | POA: Diagnosis not present

## 2015-11-22 DIAGNOSIS — R319 Hematuria, unspecified: Secondary | ICD-10-CM | POA: Diagnosis present

## 2015-11-22 DIAGNOSIS — Z87442 Personal history of urinary calculi: Secondary | ICD-10-CM | POA: Insufficient documentation

## 2015-11-22 DIAGNOSIS — E782 Mixed hyperlipidemia: Secondary | ICD-10-CM | POA: Diagnosis not present

## 2015-11-22 DIAGNOSIS — Z9889 Other specified postprocedural states: Secondary | ICD-10-CM | POA: Diagnosis not present

## 2015-11-22 DIAGNOSIS — Z794 Long term (current) use of insulin: Secondary | ICD-10-CM | POA: Diagnosis not present

## 2015-11-22 DIAGNOSIS — Z7902 Long term (current) use of antithrombotics/antiplatelets: Secondary | ICD-10-CM | POA: Diagnosis not present

## 2015-11-22 DIAGNOSIS — E119 Type 2 diabetes mellitus without complications: Secondary | ICD-10-CM | POA: Insufficient documentation

## 2015-11-22 DIAGNOSIS — Z9861 Coronary angioplasty status: Secondary | ICD-10-CM | POA: Diagnosis not present

## 2015-11-22 DIAGNOSIS — N390 Urinary tract infection, site not specified: Secondary | ICD-10-CM

## 2015-11-22 DIAGNOSIS — N3090 Cystitis, unspecified without hematuria: Secondary | ICD-10-CM | POA: Insufficient documentation

## 2015-11-22 DIAGNOSIS — Z792 Long term (current) use of antibiotics: Secondary | ICD-10-CM | POA: Insufficient documentation

## 2015-11-22 DIAGNOSIS — I739 Peripheral vascular disease, unspecified: Secondary | ICD-10-CM | POA: Insufficient documentation

## 2015-11-22 DIAGNOSIS — Z87891 Personal history of nicotine dependence: Secondary | ICD-10-CM | POA: Diagnosis not present

## 2015-11-22 DIAGNOSIS — I10 Essential (primary) hypertension: Secondary | ICD-10-CM | POA: Insufficient documentation

## 2015-11-22 DIAGNOSIS — Z79899 Other long term (current) drug therapy: Secondary | ICD-10-CM | POA: Diagnosis not present

## 2015-11-22 LAB — BASIC METABOLIC PANEL
ANION GAP: 6 (ref 5–15)
BUN: 26 mg/dL — ABNORMAL HIGH (ref 6–20)
CALCIUM: 9.2 mg/dL (ref 8.9–10.3)
CO2: 24 mmol/L (ref 22–32)
Chloride: 106 mmol/L (ref 101–111)
Creatinine, Ser: 0.95 mg/dL (ref 0.44–1.00)
GFR calc Af Amer: >60 mL/min (ref >60)
GFR calc non Af Amer: 59 mL/min — ABNORMAL LOW (ref >60)
Glucose, Bld: 248 mg/dL — ABNORMAL HIGH (ref 65–99)
POTASSIUM: 4.4 mmol/L (ref 3.5–5.1)
SODIUM: 136 mmol/L (ref 135–145)

## 2015-11-22 LAB — CBC WITH DIFFERENTIAL/PLATELET
BASOS PCT: 0 %
Basophils Absolute: 0 10*3/uL (ref 0.0–0.1)
EOS ABS: 0.1 10*3/uL (ref 0.0–0.7)
EOS PCT: 1 %
HCT: 31.9 % — ABNORMAL LOW (ref 36.0–46.0)
Hemoglobin: 10.3 g/dL — ABNORMAL LOW (ref 12.0–15.0)
Lymphocytes Relative: 18 %
Lymphs Abs: 1.5 10*3/uL (ref 0.7–4.0)
MCH: 29.3 pg (ref 26.0–34.0)
MCHC: 32.3 g/dL (ref 30.0–36.0)
MCV: 90.9 fL (ref 78.0–100.0)
MONO ABS: 0.6 10*3/uL (ref 0.1–1.0)
Monocytes Relative: 7 %
Neutro Abs: 6.4 10*3/uL (ref 1.7–7.7)
Neutrophils Relative %: 74 %
Platelets: 231 10*3/uL (ref 150–400)
RBC: 3.51 MIL/uL — AB (ref 3.87–5.11)
RDW: 11.7 % (ref 11.5–15.5)
WBC: 8.6 10*3/uL (ref 4.0–10.5)

## 2015-11-22 LAB — URINE MICROSCOPIC-ADD ON

## 2015-11-22 LAB — URINALYSIS, ROUTINE W REFLEX MICROSCOPIC
BILIRUBIN URINE: NEGATIVE
Glucose, UA: >1000 mg/dL — AB
Ketones, ur: NEGATIVE mg/dL
NITRITE: NEGATIVE
PH: 6.5 (ref 5.0–8.0)
Protein, ur: >300 mg/dL — AB
SPECIFIC GRAVITY, URINE: 1.024 (ref 1.005–1.030)

## 2015-11-22 MED ORDER — DEXTROSE 5 % IV SOLN
1.0000 g | Freq: Once | INTRAVENOUS | Status: AC
  Administered 2015-11-22: 1 g via INTRAVENOUS

## 2015-11-22 MED ORDER — SODIUM CHLORIDE 0.9 % IV BOLUS (SEPSIS)
500.0000 mL | Freq: Once | INTRAVENOUS | Status: AC
  Administered 2015-11-22: 500 mL via INTRAVENOUS

## 2015-11-22 MED ORDER — CEFTRIAXONE SODIUM 1 G IJ SOLR
INTRAMUSCULAR | Status: AC
  Filled 2015-11-22: qty 10

## 2015-11-22 MED ORDER — SULFAMETHOXAZOLE-TRIMETHOPRIM 800-160 MG PO TABS: 1.0000 | ORAL_TABLET | Freq: Two times a day (BID) | ORAL | Status: AC

## 2015-11-22 NOTE — ED Notes (Signed)
Blood in urine since 6pm. Pressure and polyuria

## 2015-11-22 NOTE — ED Provider Notes (Signed)
CSN: [id number]     Arrival date & time 11/22/15  0039 History   First MD Initiated Contact with Patient 11/22/15 0123     Chief Complaint  Patient presents with  . Hematuria     (Consider location/radiation/quality/duration/timing/severity/associated sxs/prior Treatment) Patient is a 69 y.o. female presenting with hematuria and dysuria. The history is provided by the patient and the spouse.  Hematuria This is a recurrent problem. The current episode started 12 to 24 hours ago. The problem occurs constantly. The problem has not changed since onset.Pertinent negatives include no chest pain, no abdominal pain, no headaches and no shortness of breath. Nothing aggravates the symptoms. Nothing relieves the symptoms. She has tried nothing for the symptoms. The treatment provided no relief.  Dysuria Pain quality:  Aching Pain severity:  Mild Onset quality:  Gradual Duration:  1 day Timing:  Constant Progression:  Unchanged Chronicity:  Recurrent Recent urinary tract infections: yes   Relieved by:  Nothing Worsened by:  Nothing tried Ineffective treatments:  None tried Urinary symptoms: frequent urination, hematuria and hesitancy   Associated symptoms: vomiting   Associated symptoms: no abdominal pain, no fever and no nausea   Risk factors: kidney transplant     Past Medical History  Diagnosis Date  . Essential hypertension, benign   . Mixed hyperlipidemia   . Shortness of breath on exertion   . Tobacco use disorder   . Type II diabetes mellitus (HCC)   . Shortness of breath on exertion   . Kidney stone     "long time ago"  . PVD (peripheral vascular disease), with leg pain - totally occluded Rt. SFA and stenosis in Lt. SFA 03/19/2012  . Abnormal ankle brachial index, Rt. 0.8, Lt. 0.66 03/19/2012  . Status post angioplasty with stent to Lt SFA 03/18/12 03/19/2012  . DM (diabetes mellitus), stable, insulin dependent 03/19/2012   Past Surgical History  Procedure Laterality Date  .  Shoulder arthroscopy  2000's    "I think it was left"  . Abdominal hysterectomy  1960's    w/BSO  . Cataract extraction w/ intraocular lens  implant, bilateral  ~ 2003  . Peripheral arterial stent graft  03/18/12  . Ptca  03/18/12    w/stenting LFA  . Left heart catheterization with coronary angiogram N/A 12/14/2011    Procedure: LEFT HEART CATHETERIZATION WITH CORONARY ANGIOGRAM;  Surgeon: Pamella Pert, MD;  Location: [facility] CATH LAB;  Service: Cardiovascular;  Laterality: N/A;  . Atherectomy N/A 03/18/2012    Procedure: ATHERECTOMY;  Surgeon: Runell Gess, MD;  Location: [facility] CATH LAB;  Service: Cardiovascular;  Laterality: N/A;   Family History  Problem Relation Age of Onset  . Adopted: Yes   Social History  Substance Use Topics  . Smoking status: Former Smoker -- 0.50 packs/day for 50 years    Types: Cigarettes    Quit date: 12/04/2011  . Smokeless tobacco: Never Used  . Alcohol Use: No     Comment: 03/18/12 "occasionally have one mixed drink"   OB History    No data available     Review of Systems  Constitutional: Negative for fever.  Respiratory: Negative for shortness of breath.   Cardiovascular: Negative for chest pain.  Gastrointestinal: Positive for vomiting. Negative for nausea and abdominal pain.  Genitourinary: Positive for dysuria and hematuria.  Neurological: Negative for headaches.  All other systems reviewed and are negative.     Allergies  Review of patient's allergies indicates no known allergies.  Home Medications  Prior to Admission medications   Medication Sig Start Date End Date Taking? Authorizing Provider  alendronate (FOSAMAX) 70 MG tablet Take 70 mg by mouth every 7 (seven) days. Take with a full glass of water on an empty stomach.    Historical Provider, MD  aspirin EC 81 MG tablet Take 81 mg by mouth daily.    Historical Provider, MD  calcium carbonate (OS-CAL) 600 MG TABS Take 600 mg by mouth 2 (two) times daily with a meal.     Historical Provider, MD  cephALEXin (KEFLEX) 500 MG capsule Take 1 capsule (500 mg total) by mouth 3 (three) times daily. 04/29/15   Purvis SheffieldForrest Harrison, MD  clopidogrel (PLAVIX) 75 MG tablet TAKE 1 TABLET DAILY (CALL OUR OFFICE TO SCHEDULE AN APPOINTMENT FOR FUTURE REFILLS. [phone]) 11/01/15   Dwana MelenaBryan W Hager, PA-C  esomeprazole (NEXIUM) 40 MG capsule Take 40 mg by mouth daily before breakfast.    Historical Provider, MD  estrogen, conjugated,-medroxyprogesterone (PREMPRO) 0.45-1.5 MG per tablet Take 1 tablet by mouth daily.    Historical Provider, MD  fenofibrate 160 MG tablet TAKE 1 TABLET DAILY 10/06/15   Dwana MelenaBryan W Hager, PA-C  insulin aspart (NOVOLOG) 100 UNIT/ML injection Inject into the skin 3 (three) times daily before meals.    Historical Provider, MD  insulin glargine (LANTUS) 100 UNIT/ML injection Inject 50 Units into the skin every morning.    Historical Provider, MD  l-methylfolate-B6-B12 (METANX) 3-35-2 MG TABS Take 1 tablet by mouth daily.    Historical Provider, MD  lisinopril (PRINIVIL,ZESTRIL) 5 MG tablet Take 1 tablet (5 mg total) by mouth daily. PLEASE SCHEDULE APPOINTMENT FOR REFILLS. 11/15/15   Dwana MelenaBryan W Hager, PA-C  MAGNESIUM PO Take by mouth.    Historical Provider, MD  metoprolol tartrate (LOPRESSOR) 25 MG tablet TAKE 1 TABLET TWICE A DAY (CALL OUR OFFICE TO SCHEDULE AN APPOINTMENT FOR FUTURE REFILLS. [phone]) 11/01/15   Dwana MelenaBryan W Hager, PA-C  sitaGLIPtan-metformin (JANUMET) 50-500 MG per tablet Take 1 tablet by mouth 2 (two) times daily with a meal.    Historical Provider, MD  triamterene-hydrochlorothiazide (DYAZIDE) 37.5-25 MG capsule TAKE 1 CAPSULE EVERY MORNING (CALL OUR OFFICE TO SCHEDULE AN APPOINTMENT FOR FUTURE REFILLS. [phone]) 11/01/15   Dwana MelenaBryan W Hager, PA-C   BP 159/56 mmHg  Pulse 90  Temp(Src) 97.7 F (36.5 C) (Oral)  Resp 20  Wt 140 lb (63.504 kg)  SpO2 96% Physical Exam  Constitutional: She is oriented to person, place, and time. She appears  well-developed and well-nourished. No distress.  HENT:  Head: Normocephalic and atraumatic.  Mouth/Throat: Oropharynx is clear and moist.  Eyes: Conjunctivae are normal. Pupils are equal, round, and reactive to light.  Neck: Normal range of motion. Neck supple.  Cardiovascular: Normal rate, regular rhythm and intact distal pulses.   Pulmonary/Chest: Effort normal and breath sounds normal. She has no wheezes. She has no rales.  Abdominal: Soft. Bowel sounds are normal. She exhibits no distension. There is no tenderness. There is no rebound and no guarding.  Musculoskeletal: Normal range of motion.  Neurological: She is alert and oriented to person, place, and time.  Skin: Skin is warm and dry.  Psychiatric: She has a normal mood and affect.    ED Course  Procedures (including critical care time) Labs Review Labs Reviewed  URINALYSIS, ROUTINE W REFLEX MICROSCOPIC (NOT [facility]) - Abnormal; Notable for the following:    Color, Urine RED (*)    APPearance TURBID (*)    Glucose, UA >1000 (*)  Hgb urine dipstick LARGE (*)    Protein, ur >300 (*)    Leukocytes, UA SMALL (*)    All other components within normal limits  URINE MICROSCOPIC-ADD ON - Abnormal; Notable for the following:    Squamous Epithelial / LPF 0-5 (*)    Bacteria, UA FEW (*)    All other components within normal limits  CBC WITH DIFFERENTIAL/PLATELET - Abnormal; Notable for the following:    RBC 3.51 (*)    Hemoglobin 10.3 (*)    HCT 31.9 (*)    All other components within normal limits  BASIC METABOLIC PANEL - Abnormal; Notable for the following:    Glucose, Bld 248 (*)    BUN 26 (*)    GFR calc non Af Amer 59 (*)    All other components within normal limits  URINE CULTURE    Imaging Review Ct Renal Stone Study  11/22/2015  CLINICAL DATA:  23-year-old female with hematuria and lower abdominal pain. History of kidney stones. EXAM: CT ABDOMEN AND PELVIS WITHOUT CONTRAST TECHNIQUE: Multidetector CT imaging of  the abdomen and pelvis was performed following the standard protocol without IV contrast. COMPARISON:  CT dated 06/28/2015 FINDINGS: Evaluation of this exam is limited in the absence of intravenous contrast. Bibasilar linear atelectasis/scarring. The visualized lung bases are otherwise clear. No intra-abdominal free air or free fluid identified. The liver, gallbladder, pancreas, spleen, and adrenal glands appear unremarkable. There is no hydronephrosis or nephrolithiasis on either side. The visualized ureters appear unremarkable. There is apparent diffuse thickening of the bladder wall which may be partly related to underdistention. Cystitis is not excluded. Correlation with urinalysis recommended. Small pocket of air within the urinary bladder may be related to recent instrumentation versus infection. The uterus and ovaries are grossly unremarkable. Constipation. Multiple normal caliber fecalized loops of small bowel noted suggestive of chronic stasis. No evidence of bowel obstruction or inflammation. The appendix is not visualized with certainty. No inflammatory changes identified in the right lower quadrant. There is aortoiliac atherosclerotic disease. No portal venous gas identified. There is no adenopathy. Anterior pelvic wall subcutaneous nodularity seen likely related to recent subcutaneous injections. No drainable fluid collections. There is multilevel degenerative changes of the spine. No acute fracture. IMPRESSION: No hydronephrosis or nephrolithiasis. Mild apparent diffuse bladder wall thickening. Correlation with urinalysis recommended to evaluate for cystitis. Constipation.  No evidence of bowel obstruction or inflammation. Electronically Signed   By: Elgie Collard M.D.   On: 11/22/2015 02:54   I have personally reviewed and evaluated these images and lab results as part of my medical decision-making.   EKG Interpretation None      MDM   Final diagnoses:  Hematuria    Medications   cefTRIAXone (ROCEPHIN) 1 g in dextrose 5 % 50 mL IVPB (0 g Intravenous Stopped 11/22/15 0402)  sodium chloride 0.9 % bolus 500 mL (0 mLs Intravenous Stopped 11/22/15 0439)  cefTRIAXone (ROCEPHIN) 1 G injection (  Duplicate 11/22/15 0329)     Results for orders placed or performed during the hospital encounter of 11/22/15  Urinalysis, Routine w reflex microscopic-may I&O cath if menses (not at [facility])  Result Value Ref Range   Color, Urine RED (A) YELLOW   APPearance TURBID (A) CLEAR   Specific Gravity, Urine 1.024 1.005 - 1.030   pH 6.5 5.0 - 8.0   Glucose, UA >1000 (A) NEGATIVE mg/dL   Hgb urine dipstick LARGE (A) NEGATIVE   Bilirubin Urine NEGATIVE NEGATIVE   Ketones, ur NEGATIVE NEGATIVE  mg/dL   Protein, ur >161 (A) NEGATIVE mg/dL   Nitrite NEGATIVE NEGATIVE   Leukocytes, UA SMALL (A) NEGATIVE  Urine microscopic-add on  Result Value Ref Range   Squamous Epithelial / LPF 0-5 (A) NONE SEEN   WBC, UA TOO NUMEROUS TO COUNT 0 - 5 WBC/hpf   RBC / HPF TOO NUMEROUS TO COUNT 0 - 5 RBC/hpf   Bacteria, UA FEW (A) NONE SEEN   Urine-Other URINALYSIS PERFORMED ON SUPERNATANT   CBC with Differential/Platelet  Result Value Ref Range   WBC 8.6 4.0 - 10.5 K/uL   RBC 3.51 (L) 3.87 - 5.11 MIL/uL   Hemoglobin 10.3 (L) 12.0 - 15.0 g/dL   HCT 09.6 (L) 04.5 - 40.9 %   MCV 90.9 78.0 - 100.0 fL   MCH 29.3 26.0 - 34.0 pg   MCHC 32.3 30.0 - 36.0 g/dL   RDW 81.1 91.4 - 78.2 %   Platelets 231 150 - 400 K/uL   Neutrophils Relative % 74 %   Neutro Abs 6.4 1.7 - 7.7 K/uL   Lymphocytes Relative 18 %   Lymphs Abs 1.5 0.7 - 4.0 K/uL   Monocytes Relative 7 %   Monocytes Absolute 0.6 0.1 - 1.0 K/uL   Eosinophils Relative 1 %   Eosinophils Absolute 0.1 0.0 - 0.7 K/uL   Basophils Relative 0 %   Basophils Absolute 0.0 0.0 - 0.1 K/uL  Basic metabolic panel  Result Value Ref Range   Sodium 136 135 - 145 mmol/L   Potassium 4.4 3.5 - 5.1 mmol/L   Chloride 106 101 - 111 mmol/L   CO2 24 22 - 32 mmol/L    Glucose, Bld 248 (H) 65 - 99 mg/dL   BUN 26 (H) 6 - 20 mg/dL   Creatinine, Ser 9.56 0.44 - 1.00 mg/dL   Calcium 9.2 8.9 - 21.3 mg/dL   GFR calc non Af Amer 59 (L) >60 mL/min   GFR calc Af Amer >60 >60 mL/min   Anion gap 6 5 - 15   Ct Renal Stone Study  11/22/2015  CLINICAL DATA:  44-year-old female with hematuria and lower abdominal pain. History of kidney stones. EXAM: CT ABDOMEN AND PELVIS WITHOUT CONTRAST TECHNIQUE: Multidetector CT imaging of the abdomen and pelvis was performed following the standard protocol without IV contrast. COMPARISON:  CT dated 06/28/2015 FINDINGS: Evaluation of this exam is limited in the absence of intravenous contrast. Bibasilar linear atelectasis/scarring. The visualized lung bases are otherwise clear. No intra-abdominal free air or free fluid identified. The liver, gallbladder, pancreas, spleen, and adrenal glands appear unremarkable. There is no hydronephrosis or nephrolithiasis on either side. The visualized ureters appear unremarkable. There is apparent diffuse thickening of the bladder wall which may be partly related to underdistention. Cystitis is not excluded. Correlation with urinalysis recommended. Small pocket of air within the urinary bladder may be related to recent instrumentation versus infection. The uterus and ovaries are grossly unremarkable. Constipation. Multiple normal caliber fecalized loops of small bowel noted suggestive of chronic stasis. No evidence of bowel obstruction or inflammation. The appendix is not visualized with certainty. No inflammatory changes identified in the right lower quadrant. There is aortoiliac atherosclerotic disease. No portal venous gas identified. There is no adenopathy. Anterior pelvic wall subcutaneous nodularity seen likely related to recent subcutaneous injections. No drainable fluid collections. There is multilevel degenerative changes of the spine. No acute fracture. IMPRESSION: No hydronephrosis or nephrolithiasis.  Mild apparent diffuse bladder wall thickening. Correlation with urinalysis recommended to evaluate for  cystitis. Constipation.  No evidence of bowel obstruction or inflammation. Electronically Signed   By: Elgie Collard M.D.   On: 11/22/2015 02:54   ABX chosen based on previous urine culture results  Blood in the urine is likely from cystitis but exacerbated by the fact that patient is taking plavix.  Follow up with your PMD for recheck of urine.  Follow up with urology.  Strict return precautions for fever, vomiting, weakness, inability to urinate or any concerns    Mardelle Pandolfi, MD 11/22/15 [phone]

## 2015-11-24 LAB — URINE CULTURE: Culture: >=100000

## 2015-11-25 ENCOUNTER — Telehealth (HOSPITAL_BASED_OUTPATIENT_CLINIC_OR_DEPARTMENT_OTHER): Payer: Self-pay | Admitting: Emergency Medicine

## 2015-11-25 NOTE — Telephone Encounter (Signed)
Post ED Visit - Positive Culture Follow-up  Culture report reviewed by antimicrobial stewardship pharmacist:  []  [name] [name], Pharm.D. []  [name] [name], Pharm.D., BCPS []  [name] [name], Pharm.D. [x]  [name] [name], Pharm.D., BCPS []  [name] [name], [address] BCPS, AAHIVP []  [name] [name], Pharm.D., BCPS, AAHIVP []  [name] [name], Pharm.D. []  [name] [name], [address]  Positive urine culture Enterobacter Treated with bactrim DS, organism sensitive to the same and no further patient follow-up is required at this time.  [name] [name], [name] [name] 11/25/2015, 10:50 AM

## 2016-03-31 ENCOUNTER — Other Ambulatory Visit: Payer: Self-pay | Admitting: Physician Assistant

## 2016-04-02 NOTE — Telephone Encounter (Signed)
°*  STAT* If patient is at the pharmacy, call can be transferred to refill team.   1. Which medications need to be refilled? (please list name of each medication and dose if known) Metoprolol and Clopidogrel 2. Which pharmacy/location (including street and city if local pharmacy) is medication to be sent to?[facility]  3. Do they need a 30 day or 90 day supply? 90 days supply for both and refills

## 2016-04-02 NOTE — Telephone Encounter (Signed)
Rx(s) sent to pharmacy electronically.  

## 2016-05-11 ENCOUNTER — Other Ambulatory Visit: Payer: Self-pay | Admitting: Cardiovascular Disease

## 2016-05-17 ENCOUNTER — Telehealth: Payer: Self-pay | Admitting: Cardiovascular Disease

## 2016-05-17 MED ORDER — METOPROLOL TARTRATE 25 MG PO TABS: 25.0000 mg | ORAL_TABLET | Freq: Two times a day (BID) | ORAL | Status: DC

## 2016-05-17 MED ORDER — CLOPIDOGREL BISULFATE 75 MG PO TABS: 75.0000 mg | ORAL_TABLET | Freq: Every day | ORAL | Status: DC

## 2016-05-17 NOTE — Telephone Encounter (Signed)
Returned call. Patient scheduled to see Dr. Allyson SabalBerry in July and needed her metoprolol and clopidogrel carried through. Husband called requesting 90 day supply sent to [facility]. Per review, since she has overdue office visit could not write for 90 day.  Asked to send to [facility] in [place] instead. 30 day w/ 1 refill sent to pharmacy. Pt's husband aware.

## 2016-05-17 NOTE — Telephone Encounter (Signed)
New Message   Pt c/o medication issue:  1. Name of Medication: Clopidogrel   2. How are you currently taking this medication (dosage and times per day)? 75mg  1 tablet  3. Are you having a reaction (difficulty breathing--STAT)? no  4. What is your medication issue? PT Husband called to make appt for pt. Pt needs refill for Clopidogrel 75mg . I did inform him of the refills that will avail to get filled once the appt is made. Pt husband requested to speak with RN about the refill of the med and appt. Please call back to discuss

## 2016-05-18 ENCOUNTER — Other Ambulatory Visit: Payer: Self-pay

## 2016-06-26 ENCOUNTER — Ambulatory Visit (INDEPENDENT_AMBULATORY_CARE_PROVIDER_SITE_OTHER): Payer: Medicare Other | Admitting: Cardiovascular Disease

## 2016-06-26 ENCOUNTER — Encounter: Payer: Self-pay | Admitting: Cardiovascular Disease

## 2016-06-26 VITALS — BP 134/44 | HR 72 | Ht 62.0 in | Wt 132.8 lb

## 2016-06-26 DIAGNOSIS — Z79899 Other long term (current) drug therapy: Secondary | ICD-10-CM | POA: Diagnosis not present

## 2016-06-26 DIAGNOSIS — I779 Disorder of arteries and arterioles, unspecified: Secondary | ICD-10-CM | POA: Diagnosis not present

## 2016-06-26 DIAGNOSIS — E785 Hyperlipidemia, unspecified: Secondary | ICD-10-CM | POA: Diagnosis not present

## 2016-06-26 DIAGNOSIS — I739 Peripheral vascular disease, unspecified: Secondary | ICD-10-CM | POA: Diagnosis not present

## 2016-06-26 MED ORDER — CLOPIDOGREL BISULFATE 75 MG PO TABS: 75.0000 mg | ORAL_TABLET | Freq: Every day | ORAL | 3 refills | Status: DC

## 2016-06-26 MED ORDER — METOPROLOL TARTRATE 25 MG PO TABS: 25.0000 mg | ORAL_TABLET | Freq: Two times a day (BID) | ORAL | 3 refills | Status: DC

## 2016-06-26 NOTE — Patient Instructions (Signed)
Medication Instructions:  Your physician recommends that you continue on your current medications as directed. Please refer to the Current Medication list given to you today.   Labwork: Your physician recommends that you return for lab work AT YOUR EARLIEST CONVENIENCE- YOU WILL NEED TO BE FASTING. The lab can be found on the FIRST FLOOR of out building in [address]   Testing/Procedures: Your physician has requested that you have a carotid duplex. This test is an ultrasound of the carotid arteries in your neck. It looks at blood flow through these arteries that supply the brain with blood. Allow one hour for this exam. There are no restrictions or special instructions.  Your physician has requested that you have a lower extremity arterial doppler- During this test, ultrasound is used to evaluate arterial blood flow in the legs. Allow approximately one hour for this exam.    Follow-Up: Your physician wants you to follow-up in: 12 MONTHS WITH DR Allyson Sabal. You will receive a reminder letter in the mail two months in advance. If you don't receive a letter, please call our office to schedule the follow-up appointment.   If you need a refill on your cardiac medications before your next appointment, please call your pharmacy.

## 2016-06-26 NOTE — Progress Notes (Signed)
06/26/2016 [name] [name]   06-04-1946  [id number]  Primary Physician No PCP Per Patient Primary Cardiologist: Runell Gess MD Roseanne Reno  HPI:  The patient is a 70 year old, married Asian female, mother of 2, grandmother to 4 grandchildren who is accompanied by her husband today. I saw her in the office 06/18/13.Marland Kitchen She was referred to me for peripheral vascular occlusive disease and claudication. She has noncritical CAD by cath performed by Dr. Yates Decamp December 14, 2011. Her cardiac risk factors include continued to tobacco abuse of 50 pack years, treated hypertension, dyslipidemia and diabetes. I angiogram'd her March 18, 2012, revealing high-grade diffuse segmental proximal mid left SFA stenosis which I stented using overlapping ED3 and absolute PRO Nitinol self-expanding stents. She had 3-vessel runoff bilaterally with an occluded right SFA. Followup Dopplers revealed an increase in left ABI from 0.66 to 0.99, though she is not sure whether her claudication improved. Her last Doppler study performed in our office 06/27/15 revealed a right ABI 0.77 with an occluded mid right SFA and left ABI 0.73 with a high-frequency signal in her distal left SFA. She really denies Claudication does have hip pain with ambulation. She gets occasional atypical chest pain.    Current Outpatient Prescriptions  Medication Sig Dispense Refill  . alendronate (FOSAMAX) 70 MG tablet Take 70 mg by mouth every 7 (seven) days. Take with a full glass of water on an empty stomach.    Marland Kitchen aspirin EC 81 MG tablet Take 81 mg by mouth daily.    Marland Kitchen atorvastatin (LIPITOR) 40 MG tablet Take 40 mg by mouth daily.    . calcium carbonate (OS-CAL) 600 MG TABS Take 600 mg by mouth 2 (two) times daily with a meal.    . clopidogrel (PLAVIX) 75 MG tablet Take 1 tablet (75 mg total) by mouth daily. Keep appointment. 30 tablet 1  . Empagliflozin-Linagliptin 10-5 MG TABS Take 1 tablet by mouth daily.    Marland Kitchen estrogen,  conjugated,-medroxyprogesterone (PREMPRO) 0.45-1.5 MG per tablet Take 1 tablet by mouth daily.    . fenofibrate 160 MG tablet TAKE 1 TABLET DAILY 90 tablet 2  . gabapentin (NEURONTIN) 100 MG capsule Take 100 mg by mouth as directed.    . insulin aspart (NOVOLOG) 100 UNIT/ML injection Inject into the skin 3 (three) times daily before meals.    . insulin glargine (LANTUS) 100 UNIT/ML injection Inject 50 Units into the skin every morning.    Marland Kitchen l-methylfolate-B6-B12 (METANX) 3-35-2 MG TABS Take 1 tablet by mouth daily.    Marland Kitchen lisinopril (PRINIVIL,ZESTRIL) 5 MG tablet Take 1 tablet (5 mg total) by mouth daily. PLEASE SCHEDULE APPOINTMENT FOR REFILLS. 150 tablet 2  . MAGNESIUM PO Take by mouth.    . metformin (FORTAMET) 1000 MG (OSM) 24 hr tablet Take 1,000 mg by mouth 2 (two) times daily.    . metoprolol tartrate (LOPRESSOR) 25 MG tablet Take 1 tablet (25 mg total) by mouth 2 (two) times daily. Keep appointment. 30 tablet 1  . pantoprazole (PROTONIX) 40 MG tablet Take 40 mg by mouth daily.    . sitaGLIPtan-metformin (JANUMET) 50-500 MG per tablet Take 1 tablet by mouth 2 (two) times daily with a meal.    . triamterene-hydrochlorothiazide (DYAZIDE) 37.5-25 MG capsule TAKE 1 CAPSULE EVERY MORNING (CALL OUR OFFICE TO SCHEDULE AN APPOINTMENT FOR FUTURE REFILLS. [phone]) 20 capsule 0   No current facility-administered medications for this visit.     No Known Allergies  Social History  Social History  . Marital status: Married    Spouse name: N/A  . Number of children: N/A  . Years of education: N/A   Occupational History  . Not on file.   Social History Main Topics  . Smoking status: Former Smoker    Packs/day: 0.50    Years: 50.00    Types: Cigarettes    Quit date: 12/04/2011  . Smokeless tobacco: Never Used  . Alcohol use No     Comment: 03/18/12 "occasionally have one mixed drink"  . Drug use: No  . Sexual activity: Not Currently   Other Topics Concern  . Not on file   Social  History Narrative  . No narrative on file     Review of Systems: General: negative for chills, fever, night sweats or weight changes.  Cardiovascular: negative for chest pain, dyspnea on exertion, edema, orthopnea, palpitations, paroxysmal nocturnal dyspnea or shortness of breath Dermatological: negative for rash Respiratory: negative for cough or wheezing Urologic: negative for hematuria Abdominal: negative for nausea, vomiting, diarrhea, bright red blood per rectum, melena, or hematemesis Neurologic: negative for visual changes, syncope, or dizziness All other systems reviewed and are otherwise negative except as noted above.    Blood pressure (!) 134/44, pulse 72, height  (1.575 m), weight 132 lb 12.8 oz (60.2 kg).  General appearance: alert and no distress Neck: no adenopathy, no JVD, supple, symmetrical, trachea midline, thyroid not enlarged, symmetric, no tenderness/mass/nodules and Soft bilateral carotid bruits Lungs: clear to auscultation bilaterally Heart: regular rate and rhythm, S1, S2 normal, no murmur, click, rub or gallop Extremities: extremities normal, atraumatic, no cyanosis or edema  EKG normal sinus rhythm at 72 with septal Q waves. I personally reviewed this EKG  ASSESSMENT AND PLAN:   PVD (peripheral vascular disease), with leg pain - totally occluded Rt. SFA and stenosis in Lt. SFA History of peripheral arterial disease status post left SFA stenting 03/18/12 with improvement in her Doppler studies. L ABI decreased from 0.6 to .99 although is was unclear whether or not her symptoms improved. She did have an occluded right SFA as well. She complains of bilateral hip pain with ambulation. Her last lower extremity Dopplers performed 06/27/15 revealed ABIs in the 0.7 range bilaterally with an occluded right SFA and moderately we increased velocities in the distal left SFA. I am going to repeat lower extremity arterial Doppler studies.  Tobacco  dependence Discontinued several years ago  Essential hypertension, benign History of hypertension blood pressure measured 134/44. She is on lisinopril and Dyazide. Continue current meds at current dosing  Hyperlipidemia, mixed History of hyperlipidemia on statin therapy. We will recheck a lipid and liver profile      Runell Gess MD [facility], [facility] 06/26/2016 3:55 PM

## 2016-06-26 NOTE — Assessment & Plan Note (Signed)
History of peripheral arterial disease status post left SFA stenting 03/18/12 with improvement in her Doppler studies. L ABI decreased from 0.6 to .99 although is was unclear whether or not her symptoms improved. She did have an occluded right SFA as well. She complains of bilateral hip pain with ambulation. Her last lower extremity Dopplers performed 06/27/15 revealed ABIs in the 0.7 range bilaterally with an occluded right SFA and moderately we increased velocities in the distal left SFA. I am going to repeat lower extremity arterial Doppler studies.

## 2016-06-26 NOTE — Assessment & Plan Note (Signed)
History of hyperlipidemia on statin therapy. We will recheck a lipid and liver profile 

## 2016-06-26 NOTE — Assessment & Plan Note (Signed)
Discontinued several years ago

## 2016-06-26 NOTE — Assessment & Plan Note (Signed)
History of hypertension blood pressure measured 134/44. She is on lisinopril and Dyazide. Continue current meds at current dosing

## 2016-06-27 LAB — LIPID PANEL
CHOLESTEROL: 197 mg/dL (ref 125–200)
HDL: 56 mg/dL (ref >=46)
LDL Cholesterol: 105 mg/dL (ref <130)
TRIGLYCERIDES: 178 mg/dL — AB (ref <150)
Total CHOL/HDL Ratio: 3.5 Ratio (ref <=5.0)
VLDL: 36 mg/dL — ABNORMAL HIGH (ref <30)

## 2016-06-27 LAB — HEPATIC FUNCTION PANEL
ALT: 15 U/L (ref 6–29)
AST: 18 U/L (ref 10–35)
Albumin: 4.1 g/dL (ref 3.6–5.1)
Alkaline Phosphatase: 51 U/L (ref 33–130)
Bilirubin, Direct: 0.1 mg/dL (ref <=0.2)
Indirect Bilirubin: 0.3 mg/dL (ref 0.2–1.2)
TOTAL PROTEIN: 6.6 g/dL (ref 6.1–8.1)
Total Bilirubin: 0.4 mg/dL (ref 0.2–1.2)

## 2016-06-28 ENCOUNTER — Other Ambulatory Visit: Payer: Self-pay | Admitting: Cardiovascular Disease

## 2016-06-28 DIAGNOSIS — E785 Hyperlipidemia, unspecified: Secondary | ICD-10-CM

## 2016-06-28 DIAGNOSIS — Z79899 Other long term (current) drug therapy: Secondary | ICD-10-CM

## 2016-06-28 DIAGNOSIS — I779 Disorder of arteries and arterioles, unspecified: Secondary | ICD-10-CM

## 2016-06-28 DIAGNOSIS — I739 Peripheral vascular disease, unspecified: Secondary | ICD-10-CM

## 2016-06-28 NOTE — Addendum Note (Signed)
Addended by: Freddi Starr on: 06/28/2016 10:01 AM   Modules accepted: Orders

## 2016-07-05 ENCOUNTER — Telehealth: Payer: Self-pay | Admitting: *Deleted

## 2016-07-05 DIAGNOSIS — Z79899 Other long term (current) drug therapy: Secondary | ICD-10-CM

## 2016-07-05 DIAGNOSIS — E785 Hyperlipidemia, unspecified: Secondary | ICD-10-CM

## 2016-07-05 MED ORDER — ATORVASTATIN CALCIUM 80 MG PO TABS: 80.0000 mg | ORAL_TABLET | Freq: Every day | ORAL | 3 refills | Status: DC

## 2016-07-05 NOTE — Telephone Encounter (Signed)
Called patient and gave her results and Dr Hazle Coca advice to increase Lipitor to 80 mg by mouth once a day. Patient aware to repeat labs in 2-3 months. New Rx sent to verified pharmacy. Instructions with lab slips and when to have labs redrawn mailed to pt.

## 2016-07-05 NOTE — Telephone Encounter (Signed)
-----   Message from Runell Gess, MD sent at 06/29/2016  6:26 AM EDT ----- Improved FLP but still not at goal for secondary prevention. Increase Lipitor to 80 mg and re check if she can tolerate this

## 2016-07-11 ENCOUNTER — Ambulatory Visit (HOSPITAL_COMMUNITY)
Admission: RE | Admit: 2016-07-11 | Discharge: 2016-07-11 | Disposition: A | Discharge status: Home / Self Care | Payer: Medicare Other | Source: Ambulatory Visit | Attending: Cardiology | Admitting: Cardiology

## 2016-07-11 DIAGNOSIS — I10 Essential (primary) hypertension: Secondary | ICD-10-CM | POA: Diagnosis not present

## 2016-07-11 DIAGNOSIS — I739 Peripheral vascular disease, unspecified: Secondary | ICD-10-CM | POA: Insufficient documentation

## 2016-07-11 DIAGNOSIS — Z09 Encounter for follow-up examination after completed treatment for conditions other than malignant neoplasm: Secondary | ICD-10-CM | POA: Diagnosis present

## 2016-07-11 DIAGNOSIS — I779 Disorder of arteries and arterioles, unspecified: Secondary | ICD-10-CM | POA: Insufficient documentation

## 2016-07-11 DIAGNOSIS — E119 Type 2 diabetes mellitus without complications: Secondary | ICD-10-CM | POA: Insufficient documentation

## 2016-07-11 DIAGNOSIS — E785 Hyperlipidemia, unspecified: Secondary | ICD-10-CM

## 2016-07-11 DIAGNOSIS — I6523 Occlusion and stenosis of bilateral carotid arteries: Secondary | ICD-10-CM | POA: Diagnosis not present

## 2016-07-11 DIAGNOSIS — Z79899 Other long term (current) drug therapy: Secondary | ICD-10-CM

## 2016-07-11 DIAGNOSIS — E782 Mixed hyperlipidemia: Secondary | ICD-10-CM | POA: Insufficient documentation

## 2016-07-11 DIAGNOSIS — R938 Abnormal findings on diagnostic imaging of other specified body structures: Secondary | ICD-10-CM | POA: Insufficient documentation

## 2016-07-11 DIAGNOSIS — I70202 Unspecified atherosclerosis of native arteries of extremities, left leg: Secondary | ICD-10-CM | POA: Diagnosis not present

## 2016-07-11 DIAGNOSIS — Z720 Tobacco use: Secondary | ICD-10-CM | POA: Insufficient documentation

## 2016-07-13 ENCOUNTER — Other Ambulatory Visit: Payer: Self-pay | Admitting: *Deleted

## 2016-07-13 DIAGNOSIS — I739 Peripheral vascular disease, unspecified: Secondary | ICD-10-CM

## 2016-07-19 ENCOUNTER — Other Ambulatory Visit: Payer: Self-pay | Admitting: *Deleted

## 2016-07-19 MED ORDER — METOPROLOL TARTRATE 25 MG PO TABS
25.0000 mg | ORAL_TABLET | Freq: Two times a day (BID) | ORAL | 2 refills | Status: DC
Start: 2016-07-19 — End: 2016-07-19

## 2016-07-19 MED ORDER — CLOPIDOGREL BISULFATE 75 MG PO TABS: 75.0000 mg | ORAL_TABLET | Freq: Every day | ORAL | 2 refills | Status: DC

## 2016-07-19 MED ORDER — CLOPIDOGREL BISULFATE 75 MG PO TABS: 75.0000 mg | ORAL_TABLET | Freq: Every day | ORAL | 3 refills | Status: AC

## 2016-07-19 MED ORDER — METOPROLOL TARTRATE 25 MG PO TABS: 25.0000 mg | ORAL_TABLET | Freq: Two times a day (BID) | ORAL | 3 refills | Status: AC

## 2016-07-19 NOTE — Telephone Encounter (Signed)
clopidogrel (PLAVIX) 75 MG tablet  Medication  Date: 06/26/2016 Department[facility] Ordering/Authorizing: Runell GessJonathan J Berry, MD  Order Providers   Prescribing Provider Encounter Provider  Runell GessJonathan J Berry, MD Runell GessJonathan J Berry, MD  Medication Detail    Disp Refills Start End   clopidogrel (PLAVIX) 75 MG tablet 90 tablet 3 06/26/2016    Sig - Route: Take 1 tablet (75 mg total) by mouth daily. - Oral   Notes to Pharmacy: Cannot authorize a 90 day supply - patient has not been seen since 11/2014   E-Prescribing Status: Receipt confirmed by pharmacy (06/26/2016 3:56 PM EDT)   Pharmacy   [facility] - [place], [place] - [address]   metoprolol tartrate (LOPRESSOR) 25 MG tablet  Medication  Date: 06/26/2016 [facility] Ordering/Authorizing: Runell GessJonathan J Berry, MD  Order Providers   Prescribing Provider Encounter Provider  Runell GessJonathan J Berry, MD Runell GessJonathan J Berry, MD  Medication Detail    Disp Refills Start End   metoprolol tartrate (LOPRESSOR) 25 MG tablet 180 tablet 3 06/26/2016    Sig - Route: Take 1 tablet (25 mg total) by mouth 2 (two) times daily. - Oral   Notes to Pharmacy: Cannot authorize a 90 day supply - patient has not been seen since 11/2014   E-Prescribing Status: Receipt confirmed by pharmacy (06/26/2016 3:56 PM EDT)   Pharmacy   [facility] - [place], [place] - [address]   [facility] SAID THEY DID NOT RECEIVE THIS SO I WILL RESEND TO THEM

## 2016-10-06 LAB — LIPID PANEL
CHOL/HDL RATIO: 3 ratio (ref <=5.0)
CHOLESTEROL: 170 mg/dL (ref 125–200)
HDL: 56 mg/dL (ref >=46)
LDL Cholesterol: 86 mg/dL (ref <130)
Triglycerides: 140 mg/dL (ref <150)
VLDL: 28 mg/dL (ref <30)

## 2016-10-06 LAB — HEPATIC FUNCTION PANEL
ALBUMIN: 4.1 g/dL (ref 3.6–5.1)
ALK PHOS: 48 U/L (ref 33–130)
ALT: 15 U/L (ref 6–29)
AST: 17 U/L (ref 10–35)
BILIRUBIN TOTAL: 0.5 mg/dL (ref 0.2–1.2)
Bilirubin, Direct: 0.1 mg/dL (ref <=0.2)
Indirect Bilirubin: 0.4 mg/dL (ref 0.2–1.2)
Total Protein: 6.5 g/dL (ref 6.1–8.1)

## 2016-10-10 ENCOUNTER — Encounter: Payer: Self-pay | Admitting: *Deleted

## 2016-11-15 ENCOUNTER — Telehealth: Payer: Self-pay | Admitting: Cardiovascular Disease

## 2016-11-15 MED ORDER — FENOFIBRATE 160 MG PO TABS: 160.0000 mg | ORAL_TABLET | Freq: Every day | ORAL | 2 refills | Status: AC

## 2016-11-15 NOTE — Telephone Encounter (Signed)
New Message   *STAT* If patient is at the pharmacy, call can be transferred to refill team.   1. Which medications need to be refilled? (please list name of each medication and dose if known) Fenofibrate 160mg    2. Which pharmacy/location (including street and city if local pharmacy) is [facility] be sent to[facility]  3. Do they need a 30 day or 90 day supply? 90

## 2016-11-15 NOTE — Telephone Encounter (Signed)
Rx(s) sent to pharmacy electronically.  

## 2017-06-01 ENCOUNTER — Emergency Department (HOSPITAL_BASED_OUTPATIENT_CLINIC_OR_DEPARTMENT_OTHER): Payer: Medicare Other

## 2017-06-01 ENCOUNTER — Encounter (HOSPITAL_BASED_OUTPATIENT_CLINIC_OR_DEPARTMENT_OTHER): Payer: Self-pay | Admitting: Emergency Medicine

## 2017-06-01 ENCOUNTER — Emergency Department (HOSPITAL_COMMUNITY): Payer: Medicare Other

## 2017-06-01 ENCOUNTER — Emergency Department (HOSPITAL_BASED_OUTPATIENT_CLINIC_OR_DEPARTMENT_OTHER)
Admission: EM | Admit: 2017-06-01 | Discharge: 2017-06-02 | Disposition: A | Discharge status: Home / Self Care | Payer: Medicare Other | Attending: Emergency Medicine | Admitting: Emergency Medicine

## 2017-06-01 DIAGNOSIS — Y999 Unspecified external cause status: Secondary | ICD-10-CM | POA: Diagnosis not present

## 2017-06-01 DIAGNOSIS — S0083XA Contusion of other part of head, initial encounter: Secondary | ICD-10-CM | POA: Diagnosis not present

## 2017-06-01 DIAGNOSIS — Z794 Long term (current) use of insulin: Secondary | ICD-10-CM | POA: Diagnosis not present

## 2017-06-01 DIAGNOSIS — Y929 Unspecified place or not applicable: Secondary | ICD-10-CM | POA: Insufficient documentation

## 2017-06-01 DIAGNOSIS — I252 Old myocardial infarction: Secondary | ICD-10-CM | POA: Diagnosis not present

## 2017-06-01 DIAGNOSIS — W19XXXA Unspecified fall, initial encounter: Secondary | ICD-10-CM

## 2017-06-01 DIAGNOSIS — E119 Type 2 diabetes mellitus without complications: Secondary | ICD-10-CM | POA: Diagnosis not present

## 2017-06-01 DIAGNOSIS — I10 Essential (primary) hypertension: Secondary | ICD-10-CM | POA: Diagnosis not present

## 2017-06-01 DIAGNOSIS — Z79899 Other long term (current) drug therapy: Secondary | ICD-10-CM | POA: Diagnosis not present

## 2017-06-01 DIAGNOSIS — Y939 Activity, unspecified: Secondary | ICD-10-CM | POA: Diagnosis not present

## 2017-06-01 DIAGNOSIS — Z7982 Long term (current) use of aspirin: Secondary | ICD-10-CM | POA: Diagnosis not present

## 2017-06-01 DIAGNOSIS — R202 Paresthesia of skin: Secondary | ICD-10-CM | POA: Insufficient documentation

## 2017-06-01 DIAGNOSIS — W010XXA Fall on same level from slipping, tripping and stumbling without subsequent striking against object, initial encounter: Secondary | ICD-10-CM | POA: Insufficient documentation

## 2017-06-01 NOTE — ED Provider Notes (Signed)
[facility] Provider Note   CSN: [id number] Arrival date & time: 06/01/17  1753  By signing my name below, I, [name] [name], attest that this documentation has been prepared under the direction and in the presence of Tilden Fossa, MD. Electronically Signed: Thelma [name], Scribe. 06/01/17. 7:59 PM.  History   Chief Complaint Chief Complaint  Patient presents with  . Fall   The history is provided by the patient and a relative. No language interpreter was used.    HPI Comments: [name] [name] is a 71 y.o. female with PMHx of MI, stent placement, HTN, HLD, DM who presents to the Emergency Department complaining of numbness/tingling to her hands s/p a fall that began 4 nights ago. She states she tripped at a gate and fell straight forward and landed on her face. She has associated generalized HA. She states after the fall, she had numbness/tingling to her general body that lasted overnight, but this has since resolved. She describes the numbness/tingling in her hands as a "poking" sensation. She took advil with mild to no relief. She denies syncope. Pt takes plavix and aspirin daily. Sxs are mild and constant in nature.   Past Medical History:  Diagnosis Date  . Abnormal ankle brachial index, Rt. 0.8, Lt. 0.66 03/19/2012  . DM (diabetes mellitus), stable, insulin dependent 03/19/2012  . Essential hypertension, benign   . Kidney stone    "long time ago"  . Mixed hyperlipidemia   . PVD (peripheral vascular disease), with leg pain - totally occluded Rt. SFA and stenosis in Lt. SFA 03/19/2012  . Shortness of breath on exertion   . Shortness of breath on exertion   . Status post angioplasty with stent to Lt SFA 03/18/12 03/19/2012  . Tobacco use disorder   . Type II diabetes mellitus [facility])     Patient Active Problem List   Diagnosis Date Noted  . Onychomycosis 07/21/2014  . Biliary dyskinesia 09/25/2013  . PVD (peripheral vascular disease), with leg pain - totally occluded  Rt. SFA and stenosis in Lt. SFA 03/19/2012  . Abnormal ankle brachial index, Rt. 0.8, Lt. 0.66 03/19/2012  . Status post angioplasty with stent to Lt SFA 03/18/12 03/19/2012  . DM (diabetes mellitus), stable, insulin dependent 03/19/2012  . Hyperlipidemia, mixed 12/14/2011  . Type II or unspecified type diabetes mellitus with other coma, not stated as uncontrolled 12/14/2011  . Essential hypertension, benign 12/14/2011  . Tobacco dependence 12/14/2011  . Pericarditis 12/14/2011    Past Surgical History:  Procedure Laterality Date  . ABDOMINAL HYSTERECTOMY  1960's   w/BSO  . ATHERECTOMY N/A 03/18/2012   Procedure: ATHERECTOMY;  Surgeon: Runell Gess, MD;  Location: [facility] CATH LAB;  Service: Cardiovascular;  Laterality: N/A;  . CATARACT EXTRACTION W/ INTRAOCULAR LENS  IMPLANT, BILATERAL  ~ 2003  . LEFT HEART CATHETERIZATION WITH CORONARY ANGIOGRAM N/A 12/14/2011   Procedure: LEFT HEART CATHETERIZATION WITH CORONARY ANGIOGRAM;  Surgeon: Pamella Pert, MD;  Location: [facility] CATH LAB;  Service: Cardiovascular;  Laterality: N/A;  . PERIPHERAL ARTERIAL STENT GRAFT  03/18/12  . PTCA  03/18/12   w/stenting LFA  . SHOULDER ARTHROSCOPY  2000's   "I think it was left"    OB History    No data available       Home Medications    Prior to Admission medications   Medication Sig Start Date End Date Taking? Authorizing Provider  B Complex-C (SUPER B COMPLEX PO) Take by mouth.   Yes [provider]  empagliflozin (JARDIANCE) 25 MG TABS tablet Take 25 mg by mouth daily.   Yes [provider]  Empagliflozin-Linagliptin (GLYXAMBI) 10-5 MG TABS Take by mouth.   Yes [provider]  alendronate (FOSAMAX) 70 MG tablet Take 70 mg by mouth every 7 (seven) days. Take with a full glass of water on an empty stomach.    [provider]  aspirin EC 81 MG tablet Take 81 mg by mouth daily.    [provider]  atorvastatin (LIPITOR) 80 MG tablet Take 1 tablet (80 mg  total) by mouth daily. 07/05/16 10/03/16  Runell Gess, MD  calcium carbonate (OS-CAL) 600 MG TABS Take 600 mg by mouth 2 (two) times daily with a meal.    [provider]  clopidogrel (PLAVIX) 75 MG tablet Take 1 tablet (75 mg total) by mouth daily. 07/19/16   Runell Gess, MD  Empagliflozin-Linagliptin 10-5 MG TABS Take 1 tablet by mouth daily. 02/13/16   [provider]  estrogen, conjugated,-medroxyprogesterone (PREMPRO) 0.45-1.5 MG per tablet Take 1 tablet by mouth daily.    [provider]  fenofibrate 160 MG tablet Take 1 tablet (160 mg total) by mouth daily. 11/15/16   Runell Gess, MD  gabapentin (NEURONTIN) 100 MG capsule Take 100 mg by mouth as directed.    [provider]  insulin aspart (NOVOLOG) 100 UNIT/ML injection Inject into the skin 3 (three) times daily before meals.    [provider]  insulin glargine (LANTUS) 100 UNIT/ML injection Inject 50 Units into the skin every morning.    [provider]  l-methylfolate-B6-B12 (METANX) 3-35-2 MG TABS Take 1 tablet by mouth daily.    [provider]  lisinopril (PRINIVIL,ZESTRIL) 5 MG tablet Take 1 tablet (5 mg total) by mouth daily. PLEASE SCHEDULE APPOINTMENT FOR REFILLS. 11/15/15   Dwana Melena, PA-C  MAGNESIUM PO Take by mouth.    [provider]  metformin (FORTAMET) 1000 MG (OSM) 24 hr tablet Take 1,000 mg by mouth 2 (two) times daily. 02/13/16   [provider]  metoprolol tartrate (LOPRESSOR) 25 MG tablet Take 1 tablet (25 mg total) by mouth 2 (two) times daily. 07/19/16   Runell Gess, MD  pantoprazole (PROTONIX) 40 MG tablet Take 40 mg by mouth daily. 05/10/16   [provider]  sitaGLIPtan-metformin (JANUMET) 50-500 MG per tablet Take 1 tablet by mouth 2 (two) times daily with a meal.    [provider]  triamterene-hydrochlorothiazide (DYAZIDE) 37.5-25 MG capsule TAKE 1 CAPSULE EVERY MORNING (CALL OUR OFFICE TO  SCHEDULE AN APPOINTMENT FOR FUTURE REFILLS. [phone]) 11/01/15   Dwana Melena, PA-C    Family History Family History  Problem Relation Age of Onset  . Adopted: Yes    Social History Social History  Substance Use Topics  . Smoking status: Former Smoker    Packs/day: 0.50    Years: 50.00    Types: Cigarettes    Quit date: 12/04/2011  . Smokeless tobacco: Never Used  . Alcohol use No     Allergies   Patient has no known allergies.   Review of Systems Review of Systems  Neurological: Positive for numbness and headaches. Negative for syncope.  Psychiatric/Behavioral: Positive for confusion.  All other systems reviewed and are negative.    Physical Exam Updated Vital Signs BP (!) 150/50 (BP Location: Right Arm)   Pulse 72   Temp 98 F (36.7 C) (Oral)   Resp 14   Ht 5\' 3"  (1.6  m)   Wt 62.6 kg (138 lb)   SpO2 96%   BMI 24.45 kg/m   Physical Exam  Constitutional: She is oriented to person, place, and time. She appears well-developed and well-nourished.  HENT:  Head: Normocephalic and atraumatic.  Ecchymosis to chin without tenderness  Cardiovascular: Normal rate and regular rhythm.   No murmur heard. Pulmonary/Chest: Effort normal and breath sounds normal. No respiratory distress.  Abdominal: Soft. There is no tenderness. There is no rebound and no guarding.  Musculoskeletal: She exhibits no edema or tenderness.  No midline tenderness C/T/L spine tenderness  Neurological: She is alert and oriented to person, place, and time.  5/5 strength in all four extremities and sensation intact in all 4 extremities  Skin: Skin is warm and dry.  Psychiatric: She has a normal mood and affect. Her behavior is normal.  Nursing note and vitals reviewed.    ED Treatments / Results  DIAGNOSTIC STUDIES: Oxygen Saturation is 98% on RA, normal by my interpretation.    COORDINATION OF CARE: 7:46 PM Discussed treatment plan with pt at bedside and pt agreed to  plan.  Labs (all labs ordered are listed, but only abnormal results are displayed) Labs Reviewed - No data to display  EKG  EKG Interpretation None       Radiology Ct Head Wo Contrast  Result Date: 06/01/2017 CLINICAL DATA:  Status post fall few days ago with bilateral hands tingling. EXAM: CT HEAD WITHOUT CONTRAST CT CERVICAL SPINE WITHOUT CONTRAST TECHNIQUE: Multidetector CT imaging of the head and cervical spine was performed following the standard protocol without intravenous contrast. Multiplanar CT image reconstructions of the cervical spine were also generated. COMPARISON:  None. FINDINGS: CT HEAD FINDINGS Brain: No evidence of acute infarction, hemorrhage, hydrocephalus, extra-axial collection or mass lesion/mass effect. Moderate brain parenchymal volume loss and mild periventricular microangiopathy Vascular: Vascular calcifications at the skullbase. Skull: Normal. Negative for fracture or focal lesion. Sinuses/Orbits: Mild mucosal thickening of the ethmoid sinuses. CT CERVICAL SPINE FINDINGS Alignment: Straightening of cervical lordosis. Skull base and vertebrae: No acute fracture. No primary bone lesion or focal pathologic process. Soft tissues and spinal canal: No prevertebral fluid or swelling. No visible canal hematoma. Ligamentous calcifications. Disc levels:  Multilevel osteoarthritic changes, mild. Upper chest: Negative. Other: None. IMPRESSION: No acute intracranial abnormality. Moderate brain parenchymal volume loss. No evidence of acute traumatic injury to cervical spine. Multilevel osteoarthritic changes and ligamentous calcifications. Electronically Signed   By: Ted Mcalpine M.D.   On: 06/01/2017 20:41   Ct Cervical Spine Wo Contrast  Result Date: 06/01/2017 CLINICAL DATA:  Status post fall few days ago with bilateral hands tingling. EXAM: CT HEAD WITHOUT CONTRAST CT CERVICAL SPINE WITHOUT CONTRAST TECHNIQUE: Multidetector CT imaging of the head and cervical spine was  performed following the standard protocol without intravenous contrast. Multiplanar CT image reconstructions of the cervical spine were also generated. COMPARISON:  None. FINDINGS: CT HEAD FINDINGS Brain: No evidence of acute infarction, hemorrhage, hydrocephalus, extra-axial collection or mass lesion/mass effect. Moderate brain parenchymal volume loss and mild periventricular microangiopathy Vascular: Vascular calcifications at the skullbase. Skull: Normal. Negative for fracture or focal lesion. Sinuses/Orbits: Mild mucosal thickening of the ethmoid sinuses. CT CERVICAL SPINE FINDINGS Alignment: Straightening of cervical lordosis. Skull base and vertebrae: No acute fracture. No primary bone lesion or focal pathologic process. Soft tissues and spinal canal: No prevertebral fluid or swelling. No visible canal hematoma. Ligamentous calcifications. Disc levels:  Multilevel osteoarthritic changes, mild. Upper chest: Negative. Other: None. IMPRESSION:  No acute intracranial abnormality. Moderate brain parenchymal volume loss. No evidence of acute traumatic injury to cervical spine. Multilevel osteoarthritic changes and ligamentous calcifications. Electronically Signed   By: Ted Mcalpineobrinka  Dimitrova M.D.   On: 06/01/2017 20:41    Procedures Procedures (including critical care time)  Medications Ordered in ED Medications - No data to display   Initial Impression / Assessment and Plan / ED Course  I have reviewed the triage vital signs and the nursing notes.  Pertinent labs & imaging results that were available during my care of the patient were reviewed by me and considered in my medical decision making (see chart for details).     Patient here for evaluation of injuries following a fall several days ago. She complains of paresthesias to bilateral hands. She is neurovascularly intact on examination. Based on history and mechanism of fall plan to obtain MRI to evaluate for central cord injury. MRI is not  available at this facility. [facility]. Discussed with Dr. Adela LankFloyd, who accepts the patient in transfer.  Final Clinical Impressions(s) / ED Diagnoses   Final diagnoses:  Fall, initial encounter  Paresthesia    New Prescriptions New Prescriptions   No medications on file  I personally performed the services described in this documentation, which was scribed in my presence. The recorded information has been reviewed and is accurate.     Tilden Fossaees, Kyandra Mcclaine, MD 06/01/17 2126

## 2017-06-01 NOTE — ED Provider Notes (Signed)
PROGRESS NOTE                                                                                                                 [facility] Madilyn [facility]es : [facility] [name] is a 71 y.o. female presenting with mechanical fall 4 days ago with a pins and needles paresthesia to bilateral hands and feet. She is taking Plavix and aspirin.  CT brain and cervical spine negative. Plan is to obtain MRI. Please refer to previous note for full HPI, ROS, PMH and PE.   MRI levels of anterior cord and judgment greatest at C6-C7 there is a prominent right central disc protrusion, in addition to multilevel foraminal narrowing. I don't think this is the cause of her symptoms however will discuss with neurosurgery.  Neurosurgery AAP consult appreciated: He is evaluated the MRI and agrees that this is not the source of her symptoms. Recommends gabapentin and outpatient follow-up.   [name] [name], [name] Stebner, PA-C 06/02/17 0055    Gerhard MunchLockwood, Robert, MD 06/02/17 1626

## 2017-06-01 NOTE — ED Triage Notes (Signed)
Pt reports she tripped early Wednesday morning and fell on her face on carpet. Pt has bruising to chin (takes Plavix and ASA); denies LOC, n/v. Presents today with tingling in bil hands since fall.

## 2017-06-02 NOTE — Discharge Instructions (Signed)
Please follow with your primary care doctor in the next 2 days for a check-up. They must obtain records for further management.  ° °Do not hesitate to return to the Emergency Department for any new, worsening or concerning symptoms.  ° °

## 2017-06-15 ENCOUNTER — Other Ambulatory Visit: Payer: Self-pay | Admitting: Cardiovascular Disease

## 2018-03-25 ENCOUNTER — Other Ambulatory Visit: Payer: Self-pay | Admitting: Cardiovascular Disease

## 2019-06-06 IMAGING — MR MR CERVICAL SPINE W/O CM
4 of 6 series · 18 of 48 positions shown · non-contrast
Comparison: None.

CLINICAL DATA: 71 y/o  F; recent fall with bilateral hand tingling.

EXAM:
MRI CERVICAL SPINE WITHOUT CONTRAST
TECHNIQUE: Multiplanar, multisequence MR imaging of the cervical spine was
performed. No intravenous contrast was administered.

[Series 3: T2 · sagittal · 3.0mm · 0.43mm/px · 6 of 15 slices shown (1 of 2)]
[im 1/15]
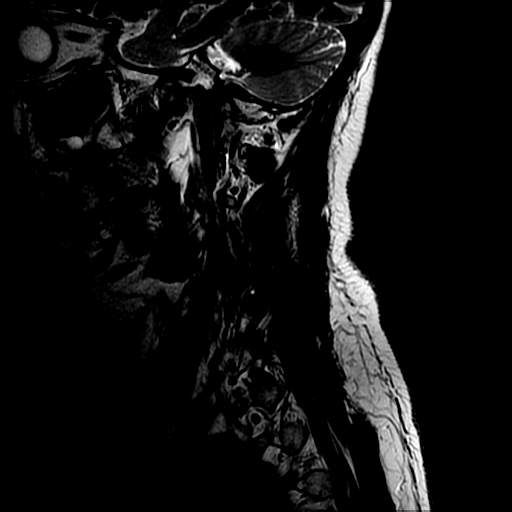
[im 3/15]
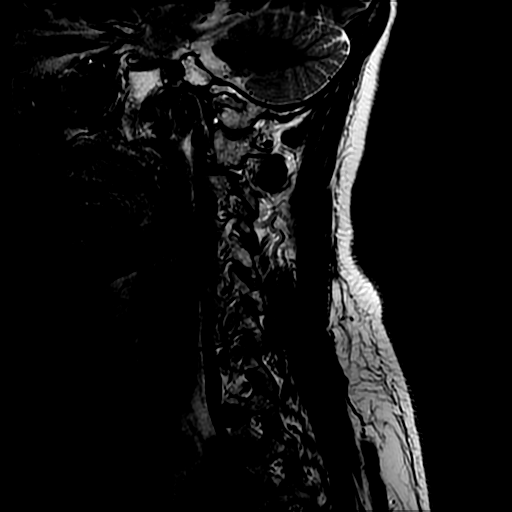
[im 6/15]
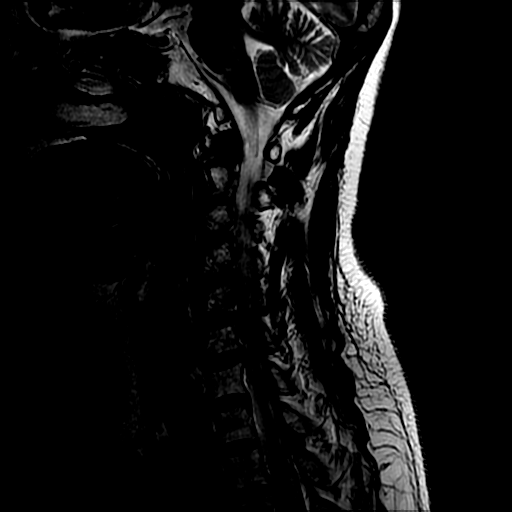
[im 9/15]
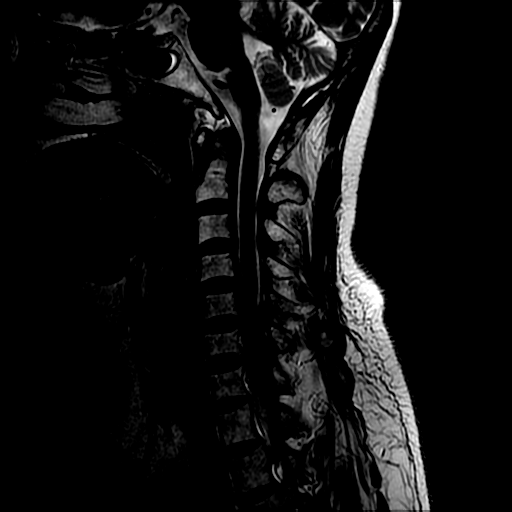
[im 12/15]
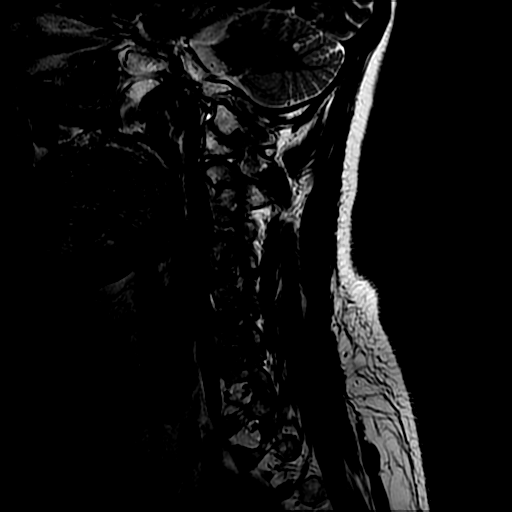
[im 15/15]
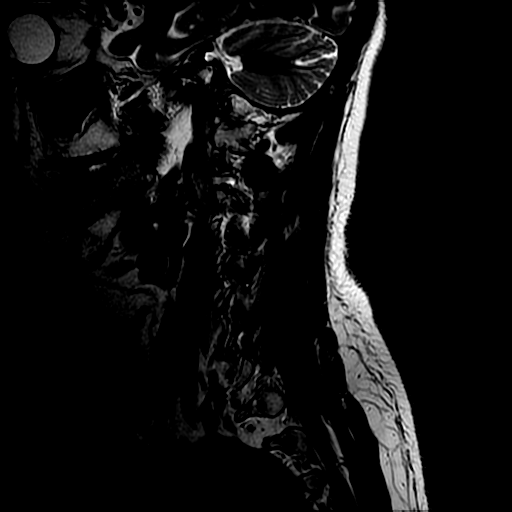

[Series 4: T1 · sagittal · 3.0mm · 0.43mm/px · 3 of 15 slices shown]
[im 3/15]
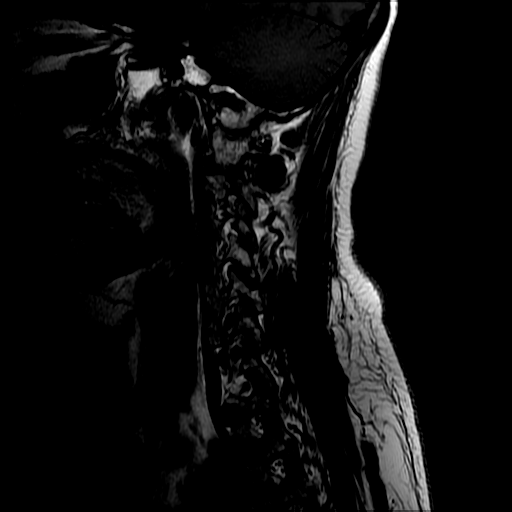
[im 9/15]
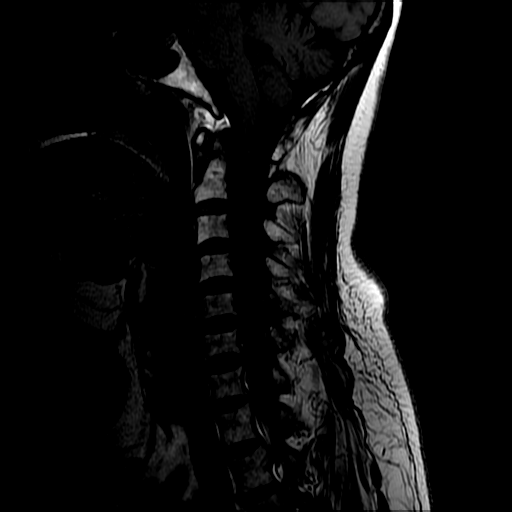
[im 15/15]
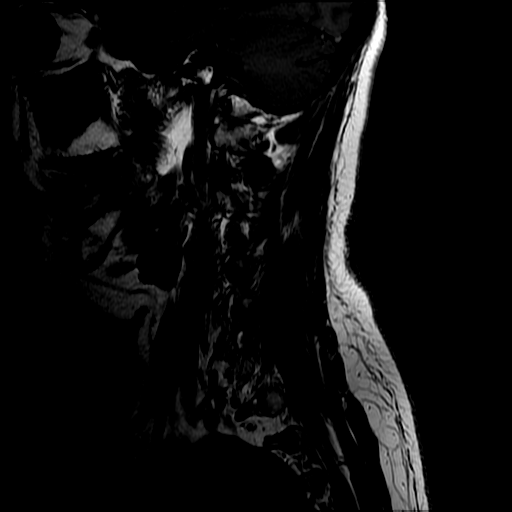

[Series 5: sag ir · sagittal · 3.0mm · 0.43mm/px · 3 of 15 slices shown]
[im 1/15]
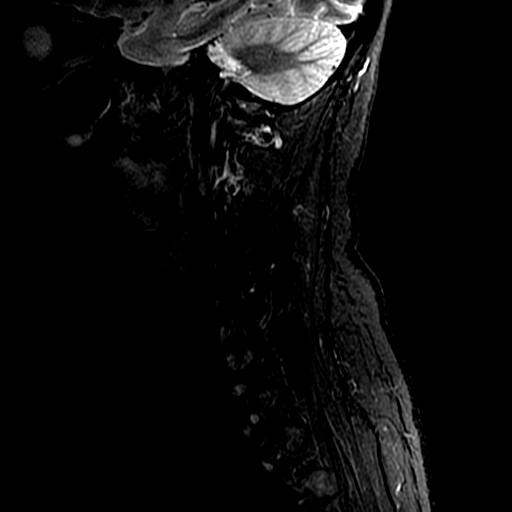
[im 8/15]
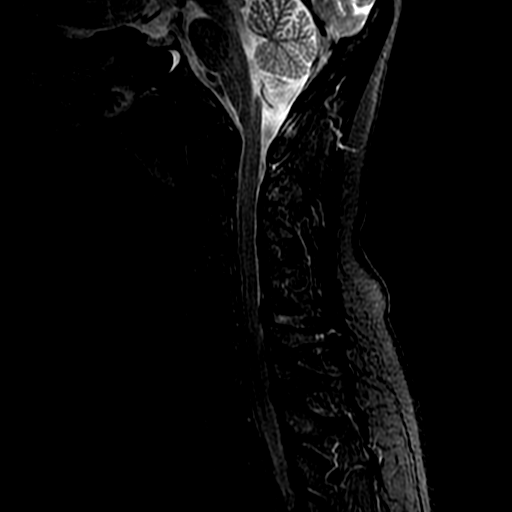
[im 15/15]
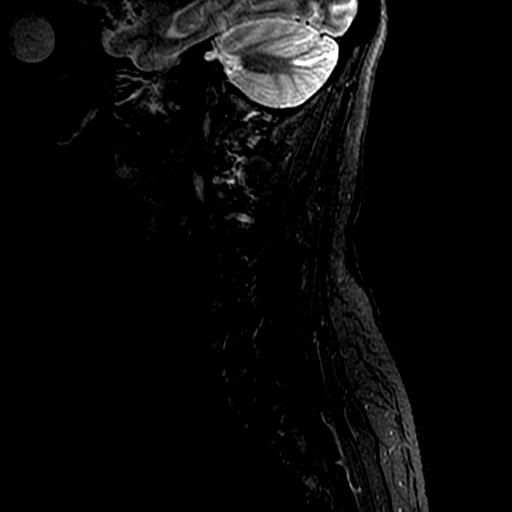

[Series 7: T2 · axial · 3.0mm · 0.39mm/px · z∈[-106,-4]mm · 6 of 38 slices shown (2 of 2)]
[im 1/38]
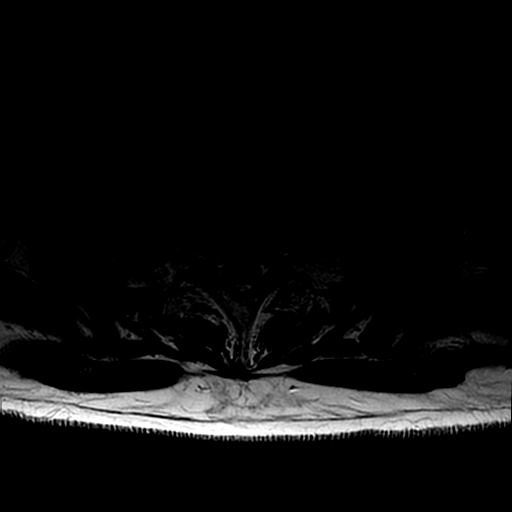
[im 7/38]
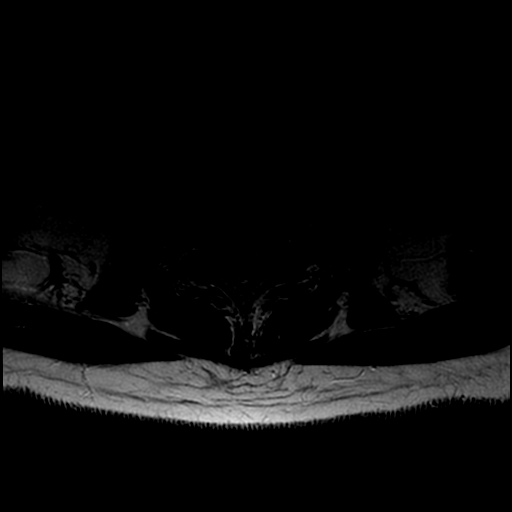
[im 13/38]
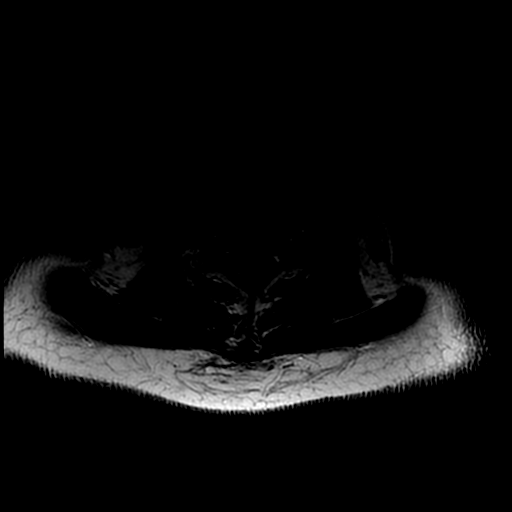
[im 16/38]
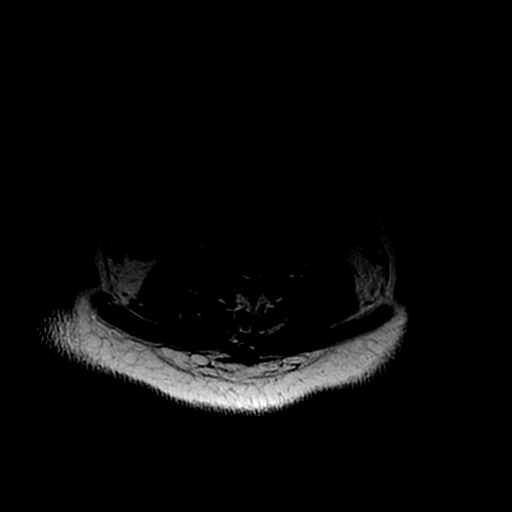
[im 19/38]
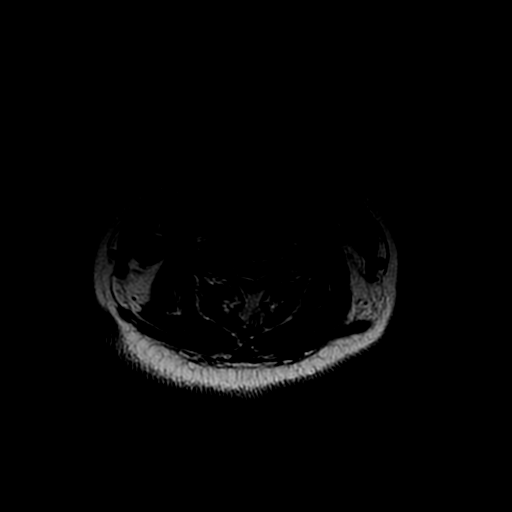
[im 31/38]
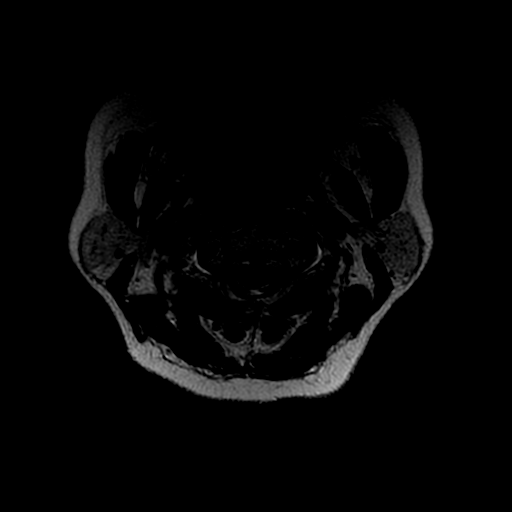

[18 of 48 positions shown; findings below may reference images not displayed]

FINDINGS: Alignment: Physiologic.

Vertebrae: No fracture, evidence of discitis, or bone lesion. Mild
edema within the C1-2 lateral joints without bone marrow edema is
likely degenerative.

Cord: No abnormal cord signal identified.

Posterior Fossa, vertebral arteries, paraspinal tissues: Negative.

Disc levels:

C2-3: No significant disc displacement, foraminal narrowing, or
canal stenosis.

C3-4: Small disc osteophyte complex with tiny central protrusion and
left-greater-than-right uncovertebral/ facet hypertrophy. Mild left
foraminal narrowing. No significant canal stenosis.

C4-5: Moderate disc osteophyte complex, tiny central protrusion with
annular fissure, and bilateral uncovertebral/ facet hypertrophy.
Mild canal stenosis with anterior cord impingement with flattening.
Moderate bilateral foraminal narrowing.

C5-6: Disc osteophyte complex eccentric to the left and
left-greater-than-right uncovertebral/ facet hypertrophy. Moderate
canal stenosis and left anterior cord impingement with flattening.
Severe left and mild right foraminal narrowing.

C6-7: Moderate disc osteophyte complex, 4 mm right central disc
protrusion, and bilateral uncovertebral/ facet hypertrophy. Moderate
bilateral foraminal narrowing. Right anterior cord impingement by
disc protrusion with moderate to severe canal stenosis.

C7-T1: No significant disc displacement, foraminal narrowing, or
canal stenosis.
IMPRESSION: 1. No acute osseous abnormality or abnormal cord signal identified.
2. Discogenic degenerative disease and small disc protrusions with
mild C4-5, moderate C5-6, and moderate to severe C6-7 canal
stenosis.
3. Multiple levels of anterior cord impingement greatest at C6-7
where there is a prominent right central disc protrusion.
4. Multilevel mild-to-moderate foraminal narrowing. Severe left
foraminal narrowing at C5-6.

By: Wai Sum Anjani M.D.

## 2019-06-17 ENCOUNTER — Telehealth: Payer: Self-pay | Admitting: Cardiovascular Disease

## 2019-06-17 NOTE — Telephone Encounter (Signed)
Called for recall, pt moved to [place]
